# Patient Record
Sex: Female | Born: 1955 | Race: White | Hispanic: No | Marital: Married | Smoking: Current every day smoker
Health system: Southern US, Community
[De-identification: ages and names within clinical notes are randomized; demographics above are authoritative.]

## PROBLEM LIST (undated history)

## (undated) DIAGNOSIS — F32A Depression, unspecified: Secondary | ICD-10-CM

## (undated) DIAGNOSIS — N83209 Unspecified ovarian cyst, unspecified side: Secondary | ICD-10-CM

## (undated) DIAGNOSIS — F329 Major depressive disorder, single episode, unspecified: Secondary | ICD-10-CM

## (undated) DIAGNOSIS — F419 Anxiety disorder, unspecified: Secondary | ICD-10-CM

## (undated) HISTORY — PX: TONSILLECTOMY: SUR1361

## (undated) HISTORY — DX: Major depressive disorder, single episode, unspecified: F32.9

## (undated) HISTORY — DX: Anxiety disorder, unspecified: F41.9

## (undated) HISTORY — DX: Depression, unspecified: F32.A

---

## 2007-03-10 ENCOUNTER — Other Ambulatory Visit: Admission: RE | Admit: 2007-03-10 | Discharge: 2007-03-10 | Payer: Self-pay | Admitting: Gynecology

## 2007-11-08 ENCOUNTER — Emergency Department (HOSPITAL_COMMUNITY): Admission: EM | Admit: 2007-11-08 | Discharge: 2007-11-08 | Payer: Self-pay | Admitting: Emergency Medicine

## 2008-05-30 ENCOUNTER — Emergency Department (HOSPITAL_COMMUNITY): Admission: EM | Admit: 2008-05-30 | Discharge: 2008-05-30 | Payer: Self-pay | Admitting: Emergency Medicine

## 2010-04-22 ENCOUNTER — Encounter: Payer: Self-pay | Admitting: Gynecology

## 2010-07-12 LAB — URINALYSIS, ROUTINE W REFLEX MICROSCOPIC
Glucose, UA: NEGATIVE mg/dL
Hgb urine dipstick: NEGATIVE
Ketones, ur: NEGATIVE mg/dL
Nitrite: NEGATIVE
Protein, ur: NEGATIVE mg/dL
pH: 6 (ref 5.0–8.0)

## 2010-07-12 LAB — DIFFERENTIAL
Basophils Relative: 0 % (ref 0–1)
Eosinophils Absolute: 0 10*3/uL (ref 0.0–0.7)
Monocytes Absolute: 0.5 10*3/uL (ref 0.1–1.0)
Monocytes Relative: 4 % (ref 3–12)
Neutrophils Relative %: 87 % — ABNORMAL HIGH (ref 43–77)

## 2010-07-12 LAB — CBC
Hemoglobin: 14.2 g/dL (ref 12.0–15.0)
RBC: 4.53 MIL/uL (ref 3.87–5.11)
RDW: 13.7 % (ref 11.5–15.5)
WBC: 13.2 10*3/uL — ABNORMAL HIGH (ref 4.0–10.5)

## 2010-07-12 LAB — COMPREHENSIVE METABOLIC PANEL
ALT: 10 U/L (ref 0–35)
Alkaline Phosphatase: 86 U/L (ref 39–117)
CO2: 27 mEq/L (ref 19–32)
Glucose, Bld: 124 mg/dL — ABNORMAL HIGH (ref 70–99)
Potassium: 4 mEq/L (ref 3.5–5.1)
Sodium: 140 mEq/L (ref 135–145)
Total Protein: 7.5 g/dL (ref 6.0–8.3)

## 2011-07-01 ENCOUNTER — Ambulatory Visit (INDEPENDENT_AMBULATORY_CARE_PROVIDER_SITE_OTHER): Payer: PRIVATE HEALTH INSURANCE | Admitting: Family Medicine

## 2011-07-01 VITALS — BP 129/72 | HR 77 | Temp 98.3°F | Resp 16 | Ht 62.0 in | Wt 130.0 lb

## 2011-07-01 DIAGNOSIS — B349 Viral infection, unspecified: Secondary | ICD-10-CM

## 2011-07-01 DIAGNOSIS — B9789 Other viral agents as the cause of diseases classified elsewhere: Secondary | ICD-10-CM

## 2011-07-01 MED ORDER — MONTELUKAST SODIUM 10 MG PO TABS: 10.0000 mg | ORAL_TABLET | Freq: Every day | ORAL | Status: DC

## 2011-07-01 NOTE — Progress Notes (Signed)
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 She has no fever. She has had some insomnia and malaise as well.  Objective: No acute distress, alert and oriented x3  HEENT: Mild retraction of both years otherwise negative  Chest: Bilateral wheezes which are faint on inspiration and expiration  Heart: Regular no murmur  Assessment: Viral syndrome  Plan: Singulair 10 daily x10

## 2011-07-01 NOTE — Patient Instructions (Signed)
Viral Syndrome You or your child has Viral Syndrome. It is the most common infection causing "colds" and infections in the nose, throat, sinuses, and breathing tubes. Sometimes the infection causes nausea, vomiting, or diarrhea. The germ that causes the infection is a virus. No antibiotic or other medicine will kill it. There are medicines that you can take to make you or your child more comfortable.  HOME CARE INSTRUCTIONS   Rest in bed until you start to feel better.   If you have diarrhea or vomiting, eat small amounts of crackers and toast. Soup is helpful.   Do not give aspirin or medicine that contains aspirin to children.   Only take over-the-counter or prescription medicines for pain, discomfort, or fever as directed by your caregiver.  SEEK IMMEDIATE MEDICAL CARE IF:   You or your child has not improved within one week.   You or your child has pain that is not at least partially relieved by over-the-counter medicine.   Thick, colored mucus or blood is coughed up.   Discharge from the nose becomes thick yellow or green.   Diarrhea or vomiting gets worse.   There is any major change in your or your child's condition.   You or your child develops a skin rash, stiff neck, severe headache, or are unable to hold down food or fluid.   You or your child has an oral temperature above 102 F (38.9 C), not controlled by medicine.   Your baby is older than 3 months with a rectal temperature of 102 F (38.9 C) or higher.   Your baby is 3 months old or younger with a rectal temperature of 100.4 F (38 C) or higher.  Document Released: 03/03/2006 Document Revised: 03/07/2011 Document Reviewed: 03/04/2007 [employer] Patient Information 2012 [employer]. 

## 2011-07-02 ENCOUNTER — Telehealth: Payer: Self-pay

## 2011-07-02 NOTE — Telephone Encounter (Signed)
Advised pt she may be contagious and she should stay out of work today. Pt understood.

## 2011-07-02 NOTE — Telephone Encounter (Signed)
PT WAS SEEN LAST NIGHT AND WANTS TO KNOW IF THE "VIRUS" SHE WAS DIAGNOSED WITH IS CONTAGIOUS.

## 2011-07-04 ENCOUNTER — Telehealth: Payer: Self-pay

## 2011-07-04 NOTE — Telephone Encounter (Signed)
[place]   Pt is slightly better, would like to talk with nurse [phone]

## 2011-07-04 NOTE — Telephone Encounter (Signed)
Spoke with patient, she states that over all she is feeling somewhat better.  She had a severe HA when she woke up yesterday--she took Motrin 600mg  and it was gone later that day.  Still having random bouts with n/v and decreased appetite, but is pushing fluids and keeping a simple diet.    She would like an OOW note for Tuesday and Wednesday faxed to her at her fax at work--#[phone].  Patient advised if no improvement/or worsening symptoms, RTC this weekend.  Is she ok for note?

## 2011-07-05 NOTE — Telephone Encounter (Signed)
Pt notified. Note faxed.

## 2011-07-05 NOTE — Telephone Encounter (Signed)
Sounds fine to me

## 2011-12-11 ENCOUNTER — Emergency Department (HOSPITAL_COMMUNITY): Payer: PRIVATE HEALTH INSURANCE

## 2011-12-11 ENCOUNTER — Emergency Department (HOSPITAL_COMMUNITY)
Admission: EM | Admit: 2011-12-11 | Discharge: 2011-12-11 | Disposition: A | Discharge status: Home / Self Care | Payer: PRIVATE HEALTH INSURANCE | Attending: Emergency Medicine | Admitting: Emergency Medicine

## 2011-12-11 ENCOUNTER — Encounter (HOSPITAL_COMMUNITY): Payer: Self-pay | Admitting: Emergency Medicine

## 2011-12-11 DIAGNOSIS — R1032 Left lower quadrant pain: Secondary | ICD-10-CM | POA: Insufficient documentation

## 2011-12-11 DIAGNOSIS — R61 Generalized hyperhidrosis: Secondary | ICD-10-CM | POA: Insufficient documentation

## 2011-12-11 DIAGNOSIS — F17200 Nicotine dependence, unspecified, uncomplicated: Secondary | ICD-10-CM | POA: Insufficient documentation

## 2011-12-11 DIAGNOSIS — N83209 Unspecified ovarian cyst, unspecified side: Secondary | ICD-10-CM

## 2011-12-11 HISTORY — DX: Unspecified ovarian cyst, unspecified side: N83.209

## 2011-12-11 LAB — CBC WITH DIFFERENTIAL/PLATELET
Basophils Absolute: 0 10*3/uL (ref 0.0–0.1)
Basophils Relative: 0 % (ref 0–1)
Eosinophils Absolute: 0.1 10*3/uL (ref 0.0–0.7)
Eosinophils Relative: 1 % (ref 0–5)
MCH: 29.6 pg (ref 26.0–34.0)
MCHC: 34.1 g/dL (ref 30.0–36.0)
MCV: 87 fL (ref 78.0–100.0)
Platelets: 209 10*3/uL (ref 150–400)
RDW: 13.9 % (ref 11.5–15.5)
WBC: 11.7 10*3/uL — ABNORMAL HIGH (ref 4.0–10.5)

## 2011-12-11 LAB — COMPREHENSIVE METABOLIC PANEL
ALT: 6 U/L (ref 0–35)
AST: 16 U/L (ref 0–37)
Calcium: 9.2 mg/dL (ref 8.4–10.5)
Sodium: 136 mEq/L (ref 135–145)
Total Protein: 7.1 g/dL (ref 6.0–8.3)

## 2011-12-11 LAB — PREGNANCY, URINE: Preg Test, Ur: NEGATIVE

## 2011-12-11 LAB — URINALYSIS, ROUTINE W REFLEX MICROSCOPIC
Glucose, UA: NEGATIVE mg/dL
Hgb urine dipstick: NEGATIVE
Leukocytes, UA: NEGATIVE
Specific Gravity, Urine: 1.027 (ref 1.005–1.030)
Urobilinogen, UA: 1 mg/dL (ref 0.0–1.0)

## 2011-12-11 MED ORDER — MORPHINE SULFATE 2 MG/ML IJ SOLN
2.0000 mg | Freq: Once | INTRAMUSCULAR | Status: AC
  Administered 2011-12-11: 2 mg via INTRAVENOUS
  Filled 2011-12-11: qty 1

## 2011-12-11 MED ORDER — HYDROCODONE-ACETAMINOPHEN 5-325 MG PO TABS: 1.0000 | ORAL_TABLET | ORAL | Status: AC | PRN

## 2011-12-11 MED ORDER — ONDANSETRON HCL 4 MG/2ML IJ SOLN
4.0000 mg | Freq: Once | INTRAMUSCULAR | Status: AC
  Administered 2011-12-11: 4 mg via INTRAVENOUS
  Filled 2011-12-11: qty 2

## 2011-12-11 NOTE — ED Notes (Signed)
Sudden onset of RLQ pain, started at 8:30am-- has hx of ovarian cyst, felt the same way-- took 800mg  Motrin- without relief (at 8:45) pain 8/10

## 2011-12-11 NOTE — ED Provider Notes (Signed)
History     CSN: [id number]  Arrival date & time 12/11/11  9147   First MD Initiated Contact with Patient 12/11/11 1058      Chief Complaint  Patient presents with  . RLQ pain     (Consider location/radiation/quality/duration/timing/severity/associated sxs/prior treatment) HPI Pt had sudden onset RLQ pain at 0830. Pain was sharp with no radiation. Pain has been constant but decreased in intensity. She states she became diaphoretic, pale and nauseated with pain onset. No vomiting/diarrhea. No urinary symptoms, vaginal bleeding or d/c. Pt has had multiple ovarian cysts and states the pain is exactly the same. She comes in because normally her pain is relieved with Ibuprofen and today it has not been.  Past Medical History  Diagnosis Date  . Ovarian cyst     Past Surgical History  Procedure Date  . Cesarean section     x two  . Tonsillectomy     No family history on file.  History  Substance Use Topics  . Smoking status: Current Every Day Smoker -- 0.5 packs/day    Types: Cigarettes  . Smokeless tobacco: Not on file  . Alcohol Use: No     occasionally    OB History    Grav Para Term Preterm Abortions TAB SAB Ect Mult Living                  Review of Systems  Constitutional: Positive for diaphoresis. Negative for fever and chills.  Respiratory: Negative for shortness of breath.   Cardiovascular: Negative for chest pain and palpitations.  Gastrointestinal: Positive for nausea and abdominal pain. Negative for vomiting, diarrhea and constipation.  Genitourinary: Positive for pelvic pain. Negative for dysuria, frequency, hematuria, flank pain, vaginal bleeding, vaginal discharge and vaginal pain.  Musculoskeletal: Negative for myalgias, back pain and arthralgias.  Skin: Positive for pallor. Negative for rash and wound.  Neurological: Positive for light-headedness. Negative for weakness, numbness and headaches.    Allergies  Review of patient's allergies indicates no  known allergies.  Home Medications   Current Outpatient Rx  Name Route Sig Dispense Refill  . IBUPROFEN 200 MG PO TABS Oral Take 800 mg by mouth every 6 (six) hours as needed. Pain    . ADULT MULTIVITAMIN W/MINERALS CH Oral Take 1 tablet by mouth daily.    Marland Kitchen HYDROCODONE-ACETAMINOPHEN 5-325 MG PO TABS Oral Take 1 tablet by mouth every 4 (four) hours as needed for pain. 15 tablet 0    BP 110/52  Pulse 75  Temp 98.4 F (36.9 C) (Oral)  Resp 16  SpO2 100%  LMP 10/27/2011  Physical Exam  Nursing note and vitals reviewed. Constitutional: She is oriented to person, place, and time. She appears well-developed and well-nourished. No distress.  HENT:  Head: Normocephalic and atraumatic.  Mouth/Throat: Oropharynx is clear and moist.  Eyes: EOM are normal. Pupils are equal, round, and reactive to light.  Neck: Normal range of motion. Neck supple.  Cardiovascular: Normal rate and regular rhythm.   Pulmonary/Chest: Effort normal and breath sounds normal. No respiratory distress. She has no wheezes. She has no rales.  Abdominal: Soft. Bowel sounds are normal. She exhibits no distension and no mass. There is no tenderness. There is no rebound and no guarding.  Musculoskeletal: Normal range of motion. She exhibits no edema and no tenderness.       No flank TTP  Neurological: She is alert and oriented to person, place, and time.  Skin: Skin is warm and dry. No rash  noted. No erythema.  Psychiatric: She has a normal mood and affect. Her behavior is normal.    ED Course  Procedures (including critical care time)  Labs Reviewed  CBC WITH DIFFERENTIAL - Abnormal; Notable for the following:    WBC 11.7 (*)     Neutrophils Relative 81 (*)     Neutro Abs 9.6 (*)     All other components within normal limits  URINALYSIS, ROUTINE W REFLEX MICROSCOPIC - Abnormal; Notable for the following:    Color, Urine AMBER (*)  BIOCHEMICALS MAY BE AFFECTED BY COLOR   APPearance CLOUDY (*)     Bilirubin  Urine SMALL (*)     Ketones, ur TRACE (*)     All other components within normal limits  COMPREHENSIVE METABOLIC PANEL  LIPASE, BLOOD  PREGNANCY, URINE   US Transvaginal Non-ob  12/11/2011  *RADIOLOGY REPORT*  Clinical Data: Right-sided pelvic pain.  History of ovarian cyst.  TRANSABDOMINAL AND TRANSVAGINAL ULTRASOUND OF PELVIS Technique:  Both transabdominal and transvaginal ultrasound examinations of the pelvis were performed. Transabdominal technique was performed for global imaging of the pelvis including uterus, ovaries, adnexal regions, and pelvic cul-de-sac.  It was necessary to proceed with endovaginal exam following the transabdominal exam to visualize the uterus, ovaries, and adnexa  .  Comparison:  CT of 05/30/2008.  No prior ultrasound.  Findings:  Uterus: 8.3 x 4.2 x 5.5 cm. Normal in morphology.  Endometrium: Normal for premenopausal state., 1.4 cm.  Right ovary:  4.7 x 3.4 x 3.9 cm.  Numerous follicles.  Suspect an involuting follicle which measures 2.1 cm on image 10 of series one.  Left ovary: 3.7 x 1.9 x 3.6 cm.  Follicles.  A peripherally hypervascular probable corpus luteal cyst measures 1.8 cm on image 53.  Other findings: Trace free pelvic fluid is likely physiologic.  IMPRESSION:  1.  Probable involuting follicle within the right ovary. 2.  Probable corpus luteal cyst within the left ovary.   Original Report Authenticated By: Consuello Bossier, M.D.    US Pelvis Complete  12/11/2011  *RADIOLOGY REPORT*  Clinical Data: Right-sided pelvic pain.  History of ovarian cyst.  TRANSABDOMINAL AND TRANSVAGINAL ULTRASOUND OF PELVIS Technique:  Both transabdominal and transvaginal ultrasound examinations of the pelvis were performed. Transabdominal technique was performed for global imaging of the pelvis including uterus, ovaries, adnexal regions, and pelvic cul-de-sac.  It was necessary to proceed with endovaginal exam following the transabdominal exam to visualize the uterus, ovaries, and adnexa   .  Comparison:  CT of 05/30/2008.  No prior ultrasound.  Findings:  Uterus: 8.3 x 4.2 x 5.5 cm. Normal in morphology.  Endometrium: Normal for premenopausal state., 1.4 cm.  Right ovary:  4.7 x 3.4 x 3.9 cm.  Numerous follicles.  Suspect an involuting follicle which measures 2.1 cm on image 10 of series one.  Left ovary: 3.7 x 1.9 x 3.6 cm.  Follicles.  A peripherally hypervascular probable corpus luteal cyst measures 1.8 cm on image 53.  Other findings: Trace free pelvic fluid is likely physiologic.  IMPRESSION:  1.  Probable involuting follicle within the right ovary. 2.  Probable corpus luteal cyst within the left ovary.   Original Report Authenticated By: Consuello Bossier, M.D.      1. Ovarian cyst       MDM    Pt is feeling much better after 2 morphine. States she is ready to eat and would like to be d/c'd home. Pt advised to f/u with  OB/GYN and take meds as needed. Return immediately for worsening pain, fever, chills or any concerns      Loren Racer, MD 12/11/11 1450

## 2011-12-11 NOTE — ED Notes (Signed)
Went to do hourly round patient out of room with xray

## 2013-06-17 ENCOUNTER — Ambulatory Visit (INDEPENDENT_AMBULATORY_CARE_PROVIDER_SITE_OTHER): Payer: PRIVATE HEALTH INSURANCE | Admitting: Family Medicine

## 2013-06-17 VITALS — BP 122/72 | HR 79 | Temp 97.9°F | Resp 16 | Ht 63.5 in | Wt 128.0 lb

## 2013-06-17 DIAGNOSIS — F411 Generalized anxiety disorder: Secondary | ICD-10-CM

## 2013-06-17 DIAGNOSIS — F419 Anxiety disorder, unspecified: Secondary | ICD-10-CM

## 2013-06-17 DIAGNOSIS — J329 Chronic sinusitis, unspecified: Secondary | ICD-10-CM

## 2013-06-17 DIAGNOSIS — R059 Cough, unspecified: Secondary | ICD-10-CM

## 2013-06-17 DIAGNOSIS — R05 Cough: Secondary | ICD-10-CM

## 2013-06-17 MED ORDER — HYDROCOD POLST-CHLORPHEN POLST 10-8 MG/5ML PO LQCR: 5.0000 mL | Freq: Every evening | ORAL | Status: DC | PRN

## 2013-06-17 MED ORDER — AMOXICILLIN 875 MG PO TABS: 875.0000 mg | ORAL_TABLET | Freq: Two times a day (BID) | ORAL | Status: DC

## 2013-06-17 MED ORDER — HYDROXYZINE HCL 25 MG PO TABS: 12.5000 mg | ORAL_TABLET | Freq: Three times a day (TID) | ORAL | Status: DC | PRN

## 2013-06-17 NOTE — Patient Instructions (Signed)
Finish antibiotic as directed Mucinex (plain) to help thin secretions Tussionex cough syrup- 1 teaspoon (ONLY) at bedtime as needed for cough Vistaril- 1/2 to one tablet every 8 hours as needed for anxiety- may make you drowsy  Sinusitis Sinusitis is redness, soreness, and swelling (inflammation) of the paranasal sinuses. Paranasal sinuses are air pockets within the bones of your face (beneath the eyes, the middle of the forehead, or above the eyes). In healthy paranasal sinuses, mucus is able to drain out, and air is able to circulate through them by way of your nose. However, when your paranasal sinuses are inflamed, mucus and air can become trapped. This can allow bacteria and other germs to grow and cause infection. Sinusitis can develop quickly and last only a short time (acute) or continue over a long period (chronic). Sinusitis that lasts for more than 12 weeks is considered chronic.  CAUSES  Causes of sinusitis include:  Allergies.  Structural abnormalities, such as displacement of the cartilage that separates your nostrils (deviated septum), which can decrease the air flow through your nose and sinuses and affect sinus drainage.  Functional abnormalities, such as when the small hairs (cilia) that line your sinuses and help remove mucus do not work properly or are not present. SYMPTOMS  Symptoms of acute and chronic sinusitis are the same. The primary symptoms are pain and pressure around the affected sinuses. Other symptoms include:  Upper toothache.  Earache.  Headache.  Bad breath.  Decreased sense of smell and taste.  A cough, which worsens when you are lying flat.  Fatigue.  Fever.  Thick drainage from your nose, which often is green and may contain pus (purulent).  Swelling and warmth over the affected sinuses. DIAGNOSIS  Your caregiver will perform a physical exam. During the exam, your caregiver may:  Look in your nose for signs of abnormal growths in your  nostrils (nasal polyps).  Tap over the affected sinus to check for signs of infection.  View the inside of your sinuses (endoscopy) with a special imaging device with a light attached (endoscope), which is inserted into your sinuses. If your caregiver suspects that you have chronic sinusitis, one or more of the following tests may be recommended:  Allergy tests.  Nasal culture A sample of mucus is taken from your nose and sent to a lab and screened for bacteria.  Nasal cytology A sample of mucus is taken from your nose and examined by your caregiver to determine if your sinusitis is related to an allergy. TREATMENT  Most cases of acute sinusitis are related to a viral infection and will resolve on their own within 10 days. Sometimes medicines are prescribed to help relieve symptoms (pain medicine, decongestants, nasal steroid sprays, or saline sprays).  However, for sinusitis related to a bacterial infection, your caregiver will prescribe antibiotic medicines. These are medicines that will help kill the bacteria causing the infection.  Rarely, sinusitis is caused by a fungal infection. In theses cases, your caregiver will prescribe antifungal medicine. For some cases of chronic sinusitis, surgery is needed. Generally, these are cases in which sinusitis recurs more than 3 times per year, despite other treatments. HOME CARE INSTRUCTIONS   Drink plenty of water. Water helps thin the mucus so your sinuses can drain more easily.  Use a humidifier.  Inhale steam 3 to 4 times a day (for example, sit in the bathroom with the shower running).  Apply a warm, moist washcloth to your face 3 to 4 times a  day, or as directed by your caregiver.  Use saline nasal sprays to help moisten and clean your sinuses.  Take over-the-counter or prescription medicines for pain, discomfort, or fever only as directed by your caregiver. SEEK IMMEDIATE MEDICAL CARE IF:  You have increasing pain or severe  headaches.  You have nausea, vomiting, or drowsiness.  You have swelling around your face.  You have vision problems.  You have a stiff neck.  You have difficulty breathing. MAKE SURE YOU:   Understand these instructions.  Will watch your condition.  Will get help right away if you are not doing well or get worse. Document Released: 03/18/2005 Document Revised: 06/10/2011 Document Reviewed: 04/02/2011 [facility] Patient Information 2014 [place], [place].

## 2013-06-17 NOTE — Progress Notes (Signed)
   Subjective:    Patient ID: Sarah Sanders, female    DOB: [birth year], 58 y.o.   MRN: [id number]  HPI Patient has had several week history of severe nasal drainage, cough, headache. Nasal drainage clear at first, but has turned thick and yellow/green. Facial pressure. Copious post nasal drainage.This morning with ear pain which was better after she got going. Took claritin with no relief. Took sudafed without improvement. Motrin without relief. Has increased fluids. Fever first couple of days of symptoms. Some chills.  Has decreased tobacco use to 2 cigarettes a day.  Has had ringing in ears when she started going through menopause. Saw ENT, who told her she had no hearing loss, and was going to have to live with it. Worsening with increased anxiety.  Tries to get plenty of exercise, decreased caffeine. Has periods of anxiety, feels like throat closes. Currently hates job. Has tried anti anxiety medications in past with weight gain and headaches. Anxiety infrequent, can go for 2 months without episode. Would like something she can use as needed that is not "addictive."  Does not have primary care provider. Has not had screening mammo, colonoscopy. [facility]  Review of Systems No shortness of breath or chest pain.     Objective:   Physical Exam  Constitutional: She appears well-developed and well-nourished.  HENT:  Head: Normocephalic and atraumatic.  Right Ear: Tympanic membrane, external ear and ear canal normal.  Left Ear: Tympanic membrane, external ear and ear canal normal.  Nose: Mucosal edema, rhinorrhea and septal deviation present. Right sinus exhibits maxillary sinus tenderness and frontal sinus tenderness. Left sinus exhibits maxillary sinus tenderness and frontal sinus tenderness.  Mouth/Throat: Uvula is midline. Oropharyngeal exudate present.      Assessment & Plan:  1. Cough - chlorpheniramine-HYDROcodone (TUSSIONEX  PENNKINETIC ER) 10-8 MG/5ML LQCR; Take 5 mLs by mouth at bedtime as needed for cough (cough).  Dispense: 70 mL; Refill: 0  2. Sinusitis - amoxicillin (AMOXIL) 875 MG tablet; Take 1 tablet (875 mg total) by mouth 2 (two) times daily.  Dispense: 20 tablet; Refill: 0 -Mucinex prn 3. Anxiety - hydrOXYzine (ATARAX/VISTARIL) 25 MG tablet; Take 0.5-1 tablets (12.5-25 mg total) by mouth every 8 (eight) hours as needed for anxiety.  Dispense: 30 tablet; Refill: 0   Patient to have appointment at 104 to establish care.   Sarah Belfasteborah B. Bleu Minerd, FNP-BC  [facility]  06/17/2013 1:43 PM   Discussed with [name]. NP and agree with above.   Sarah Sanders, MHS, PA-C [facility] [address] [place] [address] [phone] [facility] 06/17/2013 2:18 PM

## 2013-06-23 NOTE — Progress Notes (Signed)
Left a message for patient to return call.

## 2013-06-25 NOTE — Progress Notes (Signed)
Left a message for patient to return call to schedule CPE

## 2013-07-27 ENCOUNTER — Emergency Department (HOSPITAL_COMMUNITY)
Admission: EM | Admit: 2013-07-27 | Discharge: 2013-07-27 | Disposition: A | Discharge status: Home / Self Care | Payer: PRIVATE HEALTH INSURANCE | Attending: Emergency Medicine | Admitting: Emergency Medicine

## 2013-07-27 ENCOUNTER — Encounter (HOSPITAL_COMMUNITY): Payer: Self-pay | Admitting: Emergency Medicine

## 2013-07-27 DIAGNOSIS — F411 Generalized anxiety disorder: Secondary | ICD-10-CM | POA: Insufficient documentation

## 2013-07-27 DIAGNOSIS — Y9389 Activity, other specified: Secondary | ICD-10-CM | POA: Insufficient documentation

## 2013-07-27 DIAGNOSIS — IMO0002 Reserved for concepts with insufficient information to code with codable children: Secondary | ICD-10-CM | POA: Insufficient documentation

## 2013-07-27 DIAGNOSIS — F17200 Nicotine dependence, unspecified, uncomplicated: Secondary | ICD-10-CM | POA: Insufficient documentation

## 2013-07-27 DIAGNOSIS — S058X9A Other injuries of unspecified eye and orbit, initial encounter: Secondary | ICD-10-CM | POA: Insufficient documentation

## 2013-07-27 DIAGNOSIS — Z8742 Personal history of other diseases of the female genital tract: Secondary | ICD-10-CM | POA: Insufficient documentation

## 2013-07-27 DIAGNOSIS — S0501XA Injury of conjunctiva and corneal abrasion without foreign body, right eye, initial encounter: Secondary | ICD-10-CM

## 2013-07-27 DIAGNOSIS — Z23 Encounter for immunization: Secondary | ICD-10-CM | POA: Insufficient documentation

## 2013-07-27 DIAGNOSIS — Z79899 Other long term (current) drug therapy: Secondary | ICD-10-CM | POA: Insufficient documentation

## 2013-07-27 DIAGNOSIS — Y9289 Other specified places as the place of occurrence of the external cause: Secondary | ICD-10-CM | POA: Insufficient documentation

## 2013-07-27 DIAGNOSIS — Z792 Long term (current) use of antibiotics: Secondary | ICD-10-CM | POA: Insufficient documentation

## 2013-07-27 MED ORDER — ERYTHROMYCIN 5 MG/GM OP OINT
TOPICAL_OINTMENT | Freq: Once | OPHTHALMIC | Status: AC
  Administered 2013-07-27: 1 via OPHTHALMIC
  Filled 2013-07-27: qty 3.5

## 2013-07-27 MED ORDER — FLUORESCEIN SODIUM 1 MG OP STRP
1.0000 | ORAL_STRIP | Freq: Once | OPHTHALMIC | Status: AC
  Administered 2013-07-27: 1 via OPHTHALMIC
  Filled 2013-07-27: qty 1

## 2013-07-27 MED ORDER — ACETAMINOPHEN 500 MG PO TABS: 1000.0000 mg | ORAL_TABLET | Freq: Once | ORAL | Status: DC

## 2013-07-27 MED ORDER — TETRACAINE HCL 0.5 % OP SOLN
1.0000 [drp] | Freq: Once | OPHTHALMIC | Status: AC
  Administered 2013-07-27: 1 [drp] via OPHTHALMIC
  Filled 2013-07-27: qty 2

## 2013-07-27 MED ORDER — OXYCODONE-ACETAMINOPHEN 5-325 MG PO TABS: 1.0000 | ORAL_TABLET | Freq: Four times a day (QID) | ORAL | Status: DC | PRN

## 2013-07-27 MED ORDER — TETANUS-DIPHTH-ACELL PERTUSSIS 5-2.5-18.5 LF-MCG/0.5 IM SUSP
0.5000 mL | Freq: Once | INTRAMUSCULAR | Status: AC
  Administered 2013-07-27: 0.5 mL via INTRAMUSCULAR
  Filled 2013-07-27: qty 0.5

## 2013-07-27 NOTE — ED Notes (Signed)
Pt states that her dog was in bed with her and must have been spooked by something around 0300 and dog lunged back paw caught  pt's right eye. Pt eye draining and pt having trouble keeping eye open.

## 2013-07-27 NOTE — ED Provider Notes (Signed)
CSN: [id number]     Arrival date & time 07/27/13  0801 History   First MD Initiated Contact with Patient 07/27/13 0809     Chief Complaint  Patient presents with  . Eye Injury     (Consider location/radiation/quality/duration/timing/severity/associated sxs/prior Treatment) The history is provided by the patient.  Sarah Sanders is a 58 y.o. female hx of ovarian cyst here with R eye pain. Her dog was in bed with her last night. He was startled around 3am and jumped towards her. She notes that the dog may have accidentally scratched her right eye with its paw. Had some pain and then fell asleep. Woke up around 5 am with more pain. She has pain when she opens her eyes and is slightly photophobic. But she denies blurry vision. Doesn't use contact lenses. Unknown tetanus.    Past Medical History  Diagnosis Date  . Ovarian cyst   . Anxiety   . Depression    Past Surgical History  Procedure Laterality Date  . Cesarean section      x two  . Tonsillectomy     No family history on file. History  Substance Use Topics  . Smoking status: Current Every Day Smoker -- 0.50 packs/day    Types: Cigarettes  . Smokeless tobacco: Not on file  . Alcohol Use: No     Comment: occasionally   OB History   Grav Para Term Preterm Abortions TAB SAB Ect Mult Living                 Review of Systems  HENT:       R eye pain   All other systems reviewed and are negative.     Allergies  Review of patient's allergies indicates no known allergies.  Home Medications   Prior to Admission medications   Medication Sig Start Date End Date Taking? Authorizing Provider  amoxicillin (AMOXIL) 875 MG tablet Take 1 tablet (875 mg total) by mouth 2 (two) times daily. 06/17/13   Emi Belfasteborah B Gessner, FNP  chlorpheniramine-HYDROcodone [facility](TUSSIONEX PENNKINETIC ER) 10-8 MG/5ML LQCR Take 5 mLs by mouth at bedtime as needed for cough (cough). 06/17/13   Emi Belfasteborah B Gessner, FNP  hydrOXYzine (ATARAX/VISTARIL) 25 MG tablet  Take 0.5-1 tablets (12.5-25 mg total) by mouth every 8 (eight) hours as needed for anxiety. 06/17/13   Emi Belfasteborah B Gessner, FNP   BP 180/70  Pulse 85  Temp(Src) 98.1 F (36.7 C) (Oral)  Resp 18  SpO2 100% Physical Exam  Nursing note and vitals reviewed. Constitutional: She is oriented to person, place, and time. She appears well-developed and well-nourished.  Uncomfortable   HENT:  Head: Normocephalic.  Mouth/Throat: Oropharynx is clear and moist.  Eyes: EOM are normal.  R eye with photophobia. No papilledema on exam. On flourescein, there is small corneal abrasion right over the pupil. No active extravasation of vitreous material. No hyphema. Lids everted and no obvious foreign body under the lids. L eye no corneal abrasion   Neck: Normal range of motion. Neck supple.  Cardiovascular: Normal rate.   Pulmonary/Chest: Effort normal.  Abdominal: Soft.  Musculoskeletal: Normal range of motion.  Neurological: She is alert and oriented to person, place, and time.  Skin: Skin is warm and dry.  Psychiatric: She has a normal mood and affect. Her behavior is normal. Judgment and thought content normal.    ED Course  Procedures (including critical care time) Labs Review Labs Reviewed - No data to display  Imaging Review No results found.  EKG Interpretation None      MDM   Final diagnoses:  None   Sarah Sanders is a 58 y.o. female here with R corneal abrasion. Tetanus updated. Given erythromycin ophthalmic ointment. Recommend motrin, tylenol for pain. Will have her f/u with ophtho.     Richardean Canalavid H Yao, MD 07/27/13 [phone]

## 2013-07-27 NOTE — Discharge Instructions (Signed)
Take tylenol, motrin for pain.   Use erythromycin ointment three times a day for a week.   You also may use artificial eye drops to keep your eye moist.   Follow up with an eye doctor.   Return to ER if you have severe pain, fever, purulent drainage from the eye.

## 2014-08-11 ENCOUNTER — Emergency Department (HOSPITAL_COMMUNITY)
Admission: EM | Admit: 2014-08-11 | Discharge: 2014-08-11 | Disposition: A | Discharge status: Home / Self Care | Payer: PRIVATE HEALTH INSURANCE | Attending: Emergency Medicine | Admitting: Emergency Medicine

## 2014-08-11 DIAGNOSIS — R5381 Other malaise: Secondary | ICD-10-CM | POA: Diagnosis not present

## 2014-08-11 DIAGNOSIS — Z720 Tobacco use: Secondary | ICD-10-CM | POA: Insufficient documentation

## 2014-08-11 DIAGNOSIS — Z8742 Personal history of other diseases of the female genital tract: Secondary | ICD-10-CM | POA: Insufficient documentation

## 2014-08-11 DIAGNOSIS — F419 Anxiety disorder, unspecified: Secondary | ICD-10-CM | POA: Insufficient documentation

## 2014-08-11 DIAGNOSIS — R531 Weakness: Secondary | ICD-10-CM | POA: Diagnosis not present

## 2014-08-11 DIAGNOSIS — R5383 Other fatigue: Secondary | ICD-10-CM | POA: Diagnosis not present

## 2014-08-11 DIAGNOSIS — R112 Nausea with vomiting, unspecified: Secondary | ICD-10-CM | POA: Diagnosis not present

## 2014-08-11 DIAGNOSIS — Z79899 Other long term (current) drug therapy: Secondary | ICD-10-CM | POA: Insufficient documentation

## 2014-08-11 LAB — CBC WITH DIFFERENTIAL/PLATELET
BASOS PCT: 0 % (ref 0–1)
Basophils Absolute: 0 10*3/uL (ref 0.0–0.1)
EOS ABS: 0.1 10*3/uL (ref 0.0–0.7)
EOS PCT: 1 % (ref 0–5)
HCT: 40.5 % (ref 36.0–46.0)
Hemoglobin: 13.7 g/dL (ref 12.0–15.0)
LYMPHS ABS: 1.7 10*3/uL (ref 0.7–4.0)
Lymphocytes Relative: 22 % (ref 12–46)
MCH: 30.8 pg (ref 26.0–34.0)
MCHC: 33.8 g/dL (ref 30.0–36.0)
MCV: 91 fL (ref 78.0–100.0)
Monocytes Absolute: 0.5 10*3/uL (ref 0.1–1.0)
Monocytes Relative: 7 % (ref 3–12)
Neutro Abs: 5.4 10*3/uL (ref 1.7–7.7)
Neutrophils Relative %: 70 % (ref 43–77)
PLATELETS: 221 10*3/uL (ref 150–400)
RBC: 4.45 MIL/uL (ref 3.87–5.11)
RDW: 13 % (ref 11.5–15.5)
WBC: 7.7 10*3/uL (ref 4.0–10.5)

## 2014-08-11 LAB — URINALYSIS, ROUTINE W REFLEX MICROSCOPIC
BILIRUBIN URINE: NEGATIVE
GLUCOSE, UA: NEGATIVE mg/dL
HGB URINE DIPSTICK: NEGATIVE
Ketones, ur: NEGATIVE mg/dL
Leukocytes, UA: NEGATIVE
NITRITE: NEGATIVE
PH: 7 (ref 5.0–8.0)
Protein, ur: NEGATIVE mg/dL
Specific Gravity, Urine: 1.004 — ABNORMAL LOW (ref 1.005–1.030)
Urobilinogen, UA: 0.2 mg/dL (ref 0.0–1.0)

## 2014-08-11 LAB — COMPREHENSIVE METABOLIC PANEL
ALBUMIN: 4.4 g/dL (ref 3.5–5.0)
ALK PHOS: 104 U/L (ref 38–126)
ALT: 11 U/L — AB (ref 14–54)
AST: 17 U/L (ref 15–41)
Anion gap: 12 (ref 5–15)
BILIRUBIN TOTAL: 0.4 mg/dL (ref 0.3–1.2)
BUN: 9 mg/dL (ref 6–20)
CHLORIDE: 103 mmol/L (ref 101–111)
CO2: 25 mmol/L (ref 22–32)
Calcium: 9.6 mg/dL (ref 8.9–10.3)
Creatinine, Ser: 0.58 mg/dL (ref 0.44–1.00)
GFR calc Af Amer: >60 mL/min (ref >60)
GFR calc non Af Amer: >60 mL/min (ref >60)
Glucose, Bld: 113 mg/dL — ABNORMAL HIGH (ref 65–99)
POTASSIUM: 4.2 mmol/L (ref 3.5–5.1)
SODIUM: 140 mmol/L (ref 135–145)
TOTAL PROTEIN: 7.7 g/dL (ref 6.5–8.1)

## 2014-08-11 MED ORDER — ONDANSETRON 4 MG PO TBDP
4.0000 mg | ORAL_TABLET | Freq: Once | ORAL | Status: AC
  Administered 2014-08-11: 4 mg via ORAL
  Filled 2014-08-11: qty 1

## 2014-08-11 MED ORDER — ONDANSETRON HCL 4 MG PO TABS: 4.0000 mg | ORAL_TABLET | Freq: Four times a day (QID) | ORAL | Status: DC

## 2014-08-11 NOTE — ED Notes (Signed)
Water and crackers provided.  °

## 2014-08-11 NOTE — Discharge Instructions (Signed)

## 2014-08-11 NOTE — ED Notes (Signed)
Multiple complaints.  Weakness and not feeling well since Sunday.  Emesis x 1 yesterday and prior on Monday morning.  ?spider bite.  Sinus congestion. On/off headache.  Crying over nothing.  Stressed lately.

## 2014-08-11 NOTE — ED Notes (Signed)
Pt noted to have small scabbed area to rt inner thigh s/p to spider bite x 1week ago. No surrounding redness or drainage noted at this time. Area healing well.

## 2014-08-11 NOTE — ED Provider Notes (Signed)
CSN: [id number]     Arrival date & time 08/11/14  [id number] History   First MD Initiated Contact with Patient 08/11/14 0809     Chief Complaint  Patient presents with  . Weakness  . Emesis      Patient is a 59 y.o. female presenting with weakness and vomiting. The history is provided by the patient. No language interpreter was used.  Weakness  Emesis [name] presents with three-four days of nausea, fatigue, malaise.  She reports increased stress over the last month and over the last few days she has felt very poorly.  She reports fatigue and decreased energy, decreased appetite.  She vomited two days ago, now resolved.  She denies any fever, chest pain, sob, diarrhea, dysuria.  She feels very stressed and emotional.  She denies any active SI.  She had a bug bite on her right leg two days ago that was red and swollen, but now it is better.  Sxs are moderate, waxing and waning, improving.    Past Medical History  Diagnosis Date  . Ovarian cyst   . Anxiety   . Depression    Past Surgical History  Procedure Laterality Date  . Cesarean section      x two  . Tonsillectomy     No family history on file. History  Substance Use Topics  . Smoking status: Current Every Day Smoker -- 0.50 packs/day    Types: Cigarettes  . Smokeless tobacco: Not on file  . Alcohol Use: No     Comment: occasionally   OB History    No data available     Review of Systems  Gastrointestinal: Positive for vomiting.  Neurological: Positive for weakness.  All other systems reviewed and are negative.     Allergies  Aspirin  Home Medications   Prior to Admission medications   Medication Sig Start Date End Date Taking? Authorizing Provider  ibuprofen (ADVIL,MOTRIN) 200 MG tablet Take 400 mg by mouth every 6 (six) hours as needed for fever, headache, moderate pain or cramping.    Yes Historical Provider, MD  oxyCODONE-acetaminophen (PERCOCET) 5-325 MG per tablet Take 1-2 tablets by mouth every 6  (six) hours as needed. Patient not taking: Reported on 08/11/2014 07/27/13   Richardean Canalavid H Yao, MD   BP 134/87 mmHg  Pulse 77  Temp(Src) 98.2 F (36.8 C) (Oral)  Resp 18  SpO2 100% Physical Exam  Constitutional: She is oriented to person, place, and time. She appears well-developed and well-nourished.  HENT:  Head: Normocephalic and atraumatic.  Cardiovascular: Normal rate and regular rhythm.   No murmur heard. Pulmonary/Chest: Effort normal and breath sounds normal. No respiratory distress.  Abdominal: Soft. There is no tenderness. There is no rebound and no guarding.  Musculoskeletal: She exhibits no edema or tenderness.  Right upper thigh with 1cm healing eschar, minimal surrounding erythema.    Neurological: She is alert and oriented to person, place, and time.  Skin: Skin is warm and dry.  Psychiatric:  Anxious and tearful  Nursing note and vitals reviewed.   ED Course  Procedures (including critical care time) Labs Review Labs Reviewed  COMPREHENSIVE METABOLIC PANEL - Abnormal; Notable for the following:    Glucose, Bld 113 (*)    ALT 11 (*)    All other components within normal limits  URINALYSIS, ROUTINE W REFLEX MICROSCOPIC - Abnormal; Notable for the following:    Specific Gravity, Urine 1.004 (*)    All other components within normal limits  CBC WITH DIFFERENTIAL/PLATELET    Imaging Review No results found.   EKG Interpretation None      MDM   Final diagnoses:  Malaise  Non-intractable vomiting with nausea, vomiting of unspecified type    Patient here for evaluation of nausea/vomiting, malaise.  Pt nontoxic appearing on exam.  Hx and presentation is not c/w SBO, serious bacterial infection, anemia, or major electrolyte abnormality.  Question stress/depression as trigger for her sxs.  Discussed therapy and pcp follow up as well as return precautions.  Pt without active SI or intent to harm self.      Tilden FossaElizabeth Inge Waldroup, MD 08/11/14 [phone]

## 2015-05-02 ENCOUNTER — Ambulatory Visit (INDEPENDENT_AMBULATORY_CARE_PROVIDER_SITE_OTHER): Payer: PRIVATE HEALTH INSURANCE | Admitting: Physician Assistant

## 2015-05-02 VITALS — BP 114/70 | HR 87 | Temp 98.5°F | Resp 18 | Ht 64.0 in | Wt 124.0 lb

## 2015-05-02 DIAGNOSIS — J018 Other acute sinusitis: Secondary | ICD-10-CM

## 2015-05-02 DIAGNOSIS — F419 Anxiety disorder, unspecified: Secondary | ICD-10-CM

## 2015-05-02 MED ORDER — HYDROXYZINE HCL 25 MG PO TABS: 25.0000 mg | ORAL_TABLET | Freq: Three times a day (TID) | ORAL | Status: DC | PRN

## 2015-05-02 MED ORDER — AMOXICILLIN 875 MG PO TABS: 875.0000 mg | ORAL_TABLET | Freq: Two times a day (BID) | ORAL | Status: DC

## 2015-05-02 NOTE — Patient Instructions (Addendum)
Please hydrate well with 64oz of water per day. Please take mucinex  every 12 hours.  Sinusitis, Adult Sinusitis is redness, soreness, and inflammation of the paranasal sinuses. Paranasal sinuses are air pockets within the bones of your face. They are located beneath your eyes, in the middle of your forehead, and above your eyes. In healthy paranasal sinuses, mucus is able to drain out, and air is able to circulate through them by way of your nose. However, when your paranasal sinuses are inflamed, mucus and air can become trapped. This can allow bacteria and other germs to grow and cause infection. Sinusitis can develop quickly and last only a short time (acute) or continue over a long period (chronic). Sinusitis that lasts for more than 12 weeks is considered chronic. CAUSES Causes of sinusitis include:  Allergies.  Structural abnormalities, such as displacement of the cartilage that separates your nostrils (deviated septum), which can decrease the air flow through your nose and sinuses and affect sinus drainage.  Functional abnormalities, such as when the small hairs (cilia) that line your sinuses and help remove mucus do not work properly or are not present. SIGNS AND SYMPTOMS Symptoms of acute and chronic sinusitis are the same. The primary symptoms are pain and pressure around the affected sinuses. Other symptoms include:  Upper toothache.  Earache.  Headache.  Bad breath.  Decreased sense of smell and taste.  A cough, which worsens when you are lying flat.  Fatigue.  Fever.  Thick drainage from your nose, which often is green and may contain pus (purulent).  Swelling and warmth over the affected sinuses. DIAGNOSIS Your health care provider will perform a physical exam. During your exam, your health care provider may perform any of the following to help determine if you have acute sinusitis or chronic sinusitis:  Look in your nose for signs of abnormal growths in  your nostrils (nasal polyps).  Tap over the affected sinus to check for signs of infection.  View the inside of your sinuses using an imaging device that has a light attached (endoscope). If your health care provider suspects that you have chronic sinusitis, one or more of the following tests may be recommended:  Allergy tests.  Nasal culture. A sample of mucus is taken from your nose, sent to a lab, and screened for bacteria.  Nasal cytology. A sample of mucus is taken from your nose and examined by your health care provider to determine if your sinusitis is related to an allergy. TREATMENT Most cases of acute sinusitis are related to a viral infection and will resolve on their own within 10 days. Sometimes, medicines are prescribed to help relieve symptoms of both acute and chronic sinusitis. These may include pain medicines, decongestants, nasal steroid sprays, or saline sprays. However, for sinusitis related to a bacterial infection, your health care provider will prescribe antibiotic medicines. These are medicines that will help kill the bacteria causing the infection. Rarely, sinusitis is caused by a fungal infection. In these cases, your health care provider will prescribe antifungal medicine. For some cases of chronic sinusitis, surgery is needed. Generally, these are cases in which sinusitis recurs more than 3 times per year, despite other treatments. HOME CARE INSTRUCTIONS  Drink plenty of water. Water helps thin the mucus so your sinuses can drain more easily.  Use a humidifier.  Inhale steam 3-4 times a day (for example, sit in the bathroom with the shower running).  Apply a warm, moist washcloth to your face 3-4 times  a day, or as directed by your health care provider.  Use saline nasal sprays to help moisten and clean your sinuses.  Take medicines only as directed by your health care provider.  If you were prescribed either an antibiotic or antifungal medicine, finish it  all even if you start to feel better. SEEK IMMEDIATE MEDICAL CARE IF:  You have increasing pain or severe headaches.  You have nausea, vomiting, or drowsiness.  You have swelling around your face.  You have vision problems.  You have a stiff neck.  You have difficulty breathing.   This information is not intended to replace advice given to you by your health care provider. Make sure you discuss any questions you have with your health care provider.   Document Released: 03/18/2005 Document Revised: 04/08/2014 Document Reviewed: 04/02/2011 Elsevier Interactive Patient Education 2016 Elsevier Inc. Generalized Anxiety Disorder Generalized anxiety disorder (GAD) is a mental disorder. It interferes with life functions, including relationships, work, and school. GAD is different from normal anxiety, which everyone experiences at some point in their lives in response to specific life events and activities. Normal anxiety actually helps [place] prepare for and get through these life events and activities. Normal anxiety goes away after the event or activity is over.  GAD causes anxiety that is not necessarily related to specific events or activities. It also causes excess anxiety in proportion to specific events or activities. The anxiety associated with GAD is also difficult to control. GAD can vary from mild to severe. People with severe GAD can have intense waves of anxiety with physical symptoms (panic attacks).  SYMPTOMS The anxiety and worry associated with GAD are difficult to control. This anxiety and worry are related to many life events and activities and also occur more days than not for 6 months or longer. People with GAD also have three or more of the following symptoms (one or more in children):  Restlessness.   Fatigue.  Difficulty concentrating.   Irritability.  Muscle tension.  Difficulty sleeping or unsatisfying sleep. DIAGNOSIS GAD is diagnosed through an assessment by  your health care provider. Your health care provider will ask you questions aboutyour mood,physical symptoms, and events in your life. Your health care provider may ask you about your medical history and use of alcohol or drugs, including prescription medicines. Your health care provider may also do a physical exam and blood tests. Certain medical conditions and the use of certain substances can cause symptoms similar to those associated with GAD. Your health care provider may refer you to a mental health specialist for further evaluation. TREATMENT The following therapies are usually used to treat GAD:   Medication. Antidepressant medication usually is prescribed for long-term daily control. Antianxiety medicines may be added in severe cases, especially when panic attacks occur.   Talk therapy (psychotherapy). Certain types of talk therapy can be helpful in treating GAD by providing support, education, and guidance. A form of talk therapy called cognitive behavioral therapy can teach you healthy ways to think about and react to daily life events and activities.  Stress managementtechniques. These include yoga, meditation, and exercise and can be very helpful when they are practiced regularly. A mental health specialist can help determine which treatment is best for you. Some people see improvement with one therapy. However, other people require a combination of therapies.   This information is not intended to replace advice given to you by your health care provider. Make sure you discuss any questions you have with  your health care provider.   Document Released: 07/13/2012 Document Revised: 04/08/2014 Document Reviewed: 07/13/2012 Elsevier Interactive Patient Education [employer]
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
## 2015-05-08 ENCOUNTER — Telehealth: Payer: Self-pay

## 2015-05-08 DIAGNOSIS — F411 Generalized anxiety disorder: Secondary | ICD-10-CM

## 2015-05-08 NOTE — Telephone Encounter (Signed)
Pt states anxiety drug(did not know name of drug) has actually caused her more anxiety,she is requesting change   Best phone for pt is [phone]   [facility]

## 2015-05-10 MED ORDER — ALPRAZOLAM 0.25 MG PO TABS: 0.2500 mg | ORAL_TABLET | Freq: Every day | ORAL | Status: DC | PRN

## 2015-05-10 NOTE — Telephone Encounter (Signed)
Instructed [place], cma, to send via fax.

## 2015-05-10 NOTE — Telephone Encounter (Signed)
Advised that we will start a .  dosing once daily prn for her symptoms.  She will meet with me in 1 month, to discuss her improvement on drug.  She was concerned of feeling sedated, however we will try a much lower dose than what she has attempted prior.
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
## 2015-05-30 ENCOUNTER — Telehealth: Payer: Self-pay

## 2015-05-30 NOTE — Telephone Encounter (Signed)
ALPRAZolam (XANAX) 0.25 MG tablet [[id number]]  Is working perfectly for patient. She promised Sarah Sanders that she would call and notify her of the results and she wants her to know that she doesn't take a whole pill, she cuts it in half and it works great. Thank you.

## 2015-05-30 NOTE — Telephone Encounter (Signed)
ALPRAZolam (XANAX) 0.25 MG tablet [[id number]]  Is working perfectly for patient. She actually cuts the pill in half and she does great with that dosage.

## 2015-05-31 NOTE — Telephone Encounter (Signed)
Called pt and advised message from provider on their voicemail.  

## 2015-05-31 NOTE — Telephone Encounter (Signed)
That is awesome.  I am happy to see this.  Please remind her that I will need to see her myself, prior to a refill of this controlled substance.

## 2016-06-20 ENCOUNTER — Encounter (HOSPITAL_COMMUNITY): Payer: Self-pay | Admitting: Emergency Medicine

## 2016-06-20 ENCOUNTER — Ambulatory Visit (HOSPITAL_COMMUNITY)
Admission: EM | Admit: 2016-06-20 | Discharge: 2016-06-20 | Disposition: A | Discharge status: Home / Self Care | Payer: PRIVATE HEALTH INSURANCE | Attending: Family Medicine | Admitting: Family Medicine

## 2016-06-20 DIAGNOSIS — F319 Bipolar disorder, unspecified: Secondary | ICD-10-CM | POA: Diagnosis not present

## 2016-06-20 DIAGNOSIS — R05 Cough: Secondary | ICD-10-CM | POA: Diagnosis not present

## 2016-06-20 DIAGNOSIS — J0100 Acute maxillary sinusitis, unspecified: Secondary | ICD-10-CM | POA: Diagnosis not present

## 2016-06-20 DIAGNOSIS — R059 Cough, unspecified: Secondary | ICD-10-CM

## 2016-06-20 MED ORDER — QUETIAPINE FUMARATE 50 MG PO TABS: 50.0000 mg | ORAL_TABLET | Freq: Every day | ORAL | 0 refills | Status: DC

## 2016-06-20 MED ORDER — AMOXICILLIN-POT CLAVULANATE 875-125 MG PO TABS: 1.0000 | ORAL_TABLET | Freq: Two times a day (BID) | ORAL | 0 refills | Status: DC

## 2016-06-20 MED ORDER — PREDNISONE 20 MG PO TABS: ORAL_TABLET | ORAL | 0 refills | Status: DC

## 2016-06-20 NOTE — ED Provider Notes (Signed)
[facility]    CSN: [id number] Arrival date & time: 06/20/16  1215     History   Chief Complaint Chief Complaint  Patient presents with  . URI    HPI Sarah Sanders is a 61 y.o. female.   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 She has a history of bipolar type I has had very poor rapport with her previous psychiatrist, Dr. Tomasa Randunningham.. Patient has recently had a manic episode (about a month ago) followed by prolonged period of great fatigue. She is interested in getting on medicine for this.  Patient's had about 2 weeks of cough and sinus congestion along with an abrasion under her nose from the discharge. She is currently smoking about 4 cigarettes a day. She's been wheezing and having difficulty bringing up the phlegm.      Past Medical History:  Diagnosis Date  . Anxiety   . Depression   . Ovarian cyst     There are no active problems to display for this patient.   Past Surgical History:  Procedure Laterality Date  . CESAREAN SECTION     x two  . TONSILLECTOMY      OB History    No data available       Home Medications    Prior to Admission medications   Medication Sig Start Date End Date Taking? Authorizing Provider  amoxicillin-clavulanate (AUGMENTIN) 875-125 MG tablet Take 1 tablet by mouth every 12 (twelve) hours. 06/20/16   Elvina SidleKurt Felishia Wartman, MD  predniSONE (DELTASONE) 20 MG tablet Two daily with food 06/20/16   Elvina SidleKurt Kamani Lewter, MD  QUEtiapine (SEROQUEL) 50 MG tablet Take 1 tablet (50 mg total) by mouth at bedtime. 06/20/16   Elvina SidleKurt Ajay Strubel, MD    Family History History reviewed. No pertinent family history.  Social History Social History  Substance Use Topics  . Smoking status: Current Every Day Smoker    Packs/day: 0.50    Types: Cigarettes  . Smokeless tobacco: Never Used  . Alcohol use No     Comment: occasionally     Allergies   Aspirin   Review of Systems Review of  Systems  Constitutional: Positive for fatigue.  HENT: Positive for congestion and sinus pressure.   Respiratory: Positive for cough and wheezing.   Gastrointestinal: Negative.   Neurological: Negative.   Psychiatric/Behavioral: Positive for agitation and sleep disturbance. The patient is nervous/anxious and is hyperactive.      Physical Exam Triage Vital Signs ED Triage Vitals  Enc Vitals Group     BP      Pulse      Resp      Temp      Temp src      SpO2      Weight      Height      Head Circumference      Peak Flow      Pain Score      Pain Loc      Pain Edu?      Excl. in GC?    No data found.   Updated Vital Signs BP 129/74 (BP Location: Left Arm)   Pulse 91   Temp 98.7 F (37.1 C) (Oral)   Resp 16   SpO2 98%    Physical Exam  Constitutional: She is oriented to person, place, and time. She appears well-developed and well-nourished.  HENT:  Right Ear: External ear normal.  Left Ear: External ear normal.  Mouth/Throat: Oropharynx is clear and moist.  Eyes: Conjunctivae and EOM are normal. Pupils are equal, round, and reactive to light.  Neck: Normal range of motion. Neck supple.  Cardiovascular: Regular rhythm and normal heart sounds.   Pulmonary/Chest: Effort normal. She has wheezes. She has rales.  Musculoskeletal: Normal range of motion.  Neurological: She is alert and oriented to person, place, and time.  Skin: Skin is warm and dry.  Abrasion under her right nasal frenulum  Nursing note and vitals reviewed.    UC Treatments / Results  Labs (all labs ordered are listed, but only abnormal results are displayed) Labs Reviewed - No data to display  EKG  EKG Interpretation None       Radiology No results found.  Procedures Procedures (including critical care time)  Medications Ordered in UC Medications - No data to display   Initial Impression / Assessment and Plan / UC Course  I have reviewed the triage vital signs and the nursing  notes.  Pertinent labs & imaging results that were available during my care of the patient were reviewed by me and considered in my medical decision making (see chart for details).     Final Clinical Impressions(s) / UC Diagnoses   Final diagnoses:  Acute maxillary sinusitis, recurrence not specified  Cough  Bipolar 1 disorder (HCC)    New Prescriptions New Prescriptions   AMOXICILLIN-CLAVULANATE (AUGMENTIN) 875-125 MG TABLET    Take 1 tablet by mouth every 12 (twelve) hours.   PREDNISONE (DELTASONE) 20 MG TABLET    Two daily with food   QUETIAPINE (SEROQUEL) 50 MG TABLET    Take 1 tablet (50 mg total) by mouth at bedtime.     Elvina Sidle, MD 06/20/16 1255

## 2016-06-20 NOTE — ED Triage Notes (Signed)
Pt c/o cold sx onset: 11 days  Sx include: HA, prod cough, rib pain due to cough on left side, nasal drainage, bilateral ear pain, laryngitis  Smokes 4 cigs per day  Also c/o anxiety  Hx of Bipolar I... Has not had any meds in "over years"  A&O x4... NAD

## 2016-06-20 NOTE — Discharge Instructions (Signed)
Make an appointment with Dr. Donell BeersPlovsky at your earliest convenience

## 2016-07-09 ENCOUNTER — Ambulatory Visit (HOSPITAL_COMMUNITY)
Admission: EM | Admit: 2016-07-09 | Discharge: 2016-07-09 | Disposition: A | Discharge status: Home / Self Care | Payer: PRIVATE HEALTH INSURANCE | Attending: Internal Medicine | Admitting: Internal Medicine

## 2016-07-09 ENCOUNTER — Other Ambulatory Visit: Payer: Self-pay | Admitting: Family Medicine

## 2016-07-09 ENCOUNTER — Encounter (HOSPITAL_COMMUNITY): Payer: Self-pay | Admitting: *Deleted

## 2016-07-09 DIAGNOSIS — M62838 Other muscle spasm: Secondary | ICD-10-CM

## 2016-07-09 DIAGNOSIS — J40 Bronchitis, not specified as acute or chronic: Secondary | ICD-10-CM

## 2016-07-09 MED ORDER — BENZONATATE 100 MG PO CAPS: 200.0000 mg | ORAL_CAPSULE | Freq: Three times a day (TID) | ORAL | 0 refills | Status: DC | PRN

## 2016-07-09 MED ORDER — CYCLOBENZAPRINE HCL 10 MG PO TABS: 10.0000 mg | ORAL_TABLET | Freq: Two times a day (BID) | ORAL | 0 refills | Status: DC | PRN

## 2016-07-09 MED ORDER — ALBUTEROL SULFATE HFA 108 (90 BASE) MCG/ACT IN AERS: 1.0000 | INHALATION_SPRAY | Freq: Four times a day (QID) | RESPIRATORY_TRACT | 0 refills | Status: DC | PRN

## 2016-07-09 NOTE — ED Triage Notes (Signed)
Seen  Here  Over  sev  Weeks  Ago  Took  Anti  Biotics  And  Prednisone      Still  Coughing         And  Has  Pain    In  Sides   When  She  Coughs

## 2016-07-09 NOTE — ED Provider Notes (Signed)
CSN: [id number]     Arrival date & time 07/09/16  1259 History   First MD Initiated Contact with Patient 07/09/16 1410     Chief Complaint  Patient presents with  . Follow-up   (Consider location/radiation/quality/duration/timing/severity/associated sxs/prior Treatment) The history is provided by the patient.  Pt presented with consistent persistent cough x 2 weeks. Reports cough is non productive, worse at night.Pt reports rifgr sided chest wall/rib cage pain from consistent/deep coughing.  Denies fever/chills/SOB. Tx with Augmentin and prednisone  for Sinusitis on 3/22. Sinus sx resolved.   Past Medical History:  Diagnosis Date  . Anxiety   . Depression   . Ovarian cyst    Past Surgical History:  Procedure Laterality Date  . CESAREAN SECTION     x two  . TONSILLECTOMY     History reviewed. No pertinent family history. Social History  Substance Use Topics  . Smoking status: Current Every Day Smoker    Packs/day: 0.50    Types: Cigarettes  . Smokeless tobacco: Never Used  . Alcohol use No     Comment: occasionally   OB History    No data available     Review of Systems  Constitutional: Negative.   HENT: Negative.   Eyes: Negative.   Respiratory: Positive for cough. Negative for chest tightness, shortness of breath and wheezing.   Cardiovascular: Negative.   Gastrointestinal: Negative.   Genitourinary: Negative.   Musculoskeletal: Positive for back pain (right sided rib cage pain from deep coughing).  Skin: Negative.   Neurological: Negative.   Psychiatric/Behavioral: Negative.     Allergies  Aspirin  Home Medications   Prior to Admission medications   Medication Sig Start Date End Date Taking? Authorizing Provider  albuterol (PROVENTIL HFA;VENTOLIN HFA) 108 (90 Base) MCG/ACT inhaler Inhale 1-2 puffs into the lungs every 6 (six) hours as needed for wheezing or shortness of breath. 07/09/16   Dinah Lupa, NP  amoxicillin-clavulanate (AUGMENTIN) 875-125 MG  tablet Take 1 tablet by mouth every 12 (twelve) hours. 06/20/16   Elvina Sidle, MD  benzonatate (TESSALON) 100 MG capsule Take 2 capsules (200 mg total) by mouth 3 (three) times daily as needed for cough. 07/09/16   Faun Mcqueen, NP  cyclobenzaprine (FLEXERIL) 10 MG tablet Take 1 tablet (10 mg total) by mouth 2 (two) times daily as needed for muscle spasms. 07/09/16   Daijanae Rafalski, NP  predniSONE (DELTASONE) 20 MG tablet Two daily with food 06/20/16   Elvina Sidle, MD  QUEtiapine (SEROQUEL) 50 MG tablet Take 1 tablet (50 mg total) by mouth at bedtime. 06/20/16   Elvina Sidle, MD   Meds Ordered and Administered this Visit  Medications - No data to display  BP (!) 143/67 (BP Location: Right Arm) Comment: notified rn  Pulse 87   Temp 98.5 F (36.9 C) (Oral)   Resp 14   SpO2 99%  No data found.   Physical Exam  Constitutional: She appears well-developed and well-nourished. She appears distressed.  HENT:  Head: Normocephalic.  Eyes: Pupils are equal, round, and reactive to light.  Cardiovascular: Normal rate, regular rhythm and normal heart sounds.   Pulmonary/Chest: Effort normal and breath sounds normal. No respiratory distress. She has no wheezes. She has no rales. She exhibits no tenderness.  Abdominal: Soft. Bowel sounds are normal.  Musculoskeletal: She exhibits tenderness (Tenderness to right mid axillary line between 5-7 rib cage. Pain reproducibe to palpation. Pt satets pain worse with coughing. ).  Skin: Skin is warm.  Urgent Care Course     Procedures (including critical care time)  Labs Review Labs Reviewed - No data to display  Imaging Review No results found.   Visual Acuity Review  Right Eye Distance:   Left Eye Distance:   Bilateral Distance:    Right Eye Near:   Left Eye Near:    Bilateral Near:         MDM   1. Bronchitis   2. Muscle spasm   right sided chest wall/rib pain highly likely from muscle spasm from deep coughing.  Heating pad to right rib cage as advised. Ibuprofen as needed for pain. Use pillow for support when coughing/sneezing Side effects from Cyclobenzaprine and Benzonatate discussed.    Lyndie Vanderloop, NP 07/09/16 1431    Nemesis Rainwater, NP 07/09/16 1432

## 2016-07-09 NOTE — Discharge Instructions (Signed)
Heating pad to right rib cage as advised. Ibuprofen as needed for pain. Use pillow for support when coughing/sneezing

## 2017-06-06 ENCOUNTER — Encounter (HOSPITAL_COMMUNITY): Payer: Self-pay | Admitting: Emergency Medicine

## 2017-06-06 ENCOUNTER — Ambulatory Visit (HOSPITAL_COMMUNITY)
Admission: EM | Admit: 2017-06-06 | Discharge: 2017-06-06 | Disposition: A | Discharge status: Home / Self Care | Payer: PRIVATE HEALTH INSURANCE | Attending: Internal Medicine | Admitting: Internal Medicine

## 2017-06-06 DIAGNOSIS — B349 Viral infection, unspecified: Secondary | ICD-10-CM

## 2017-06-06 MED ORDER — FLUTICASONE PROPIONATE 50 MCG/ACT NA SUSP: 2.0000 | Freq: Every day | NASAL | 0 refills | Status: DC

## 2017-06-06 MED ORDER — IPRATROPIUM BROMIDE 0.06 % NA SOLN: 2.0000 | Freq: Four times a day (QID) | NASAL | 0 refills | Status: DC

## 2017-06-06 NOTE — Discharge Instructions (Signed)
Start flonase, atrovent nasal spray for nasal congestion/drainage. You can use over the counter nasal saline rinse such as neti pot for nasal congestion. Keep hydrated, your urine should be clear to pale yellow in color. Tylenol/motrin for fever and pain. Monitor for any worsening of symptoms, chest pain, shortness of breath, wheezing, swelling of the throat, follow up for reevaluation. Follow up here or with PCP for reevaluation if symptoms not improving after 10 day of onset of symptoms.

## 2017-06-06 NOTE — ED Triage Notes (Signed)
Pt here for URI sx with body aches and sinus pressure x 2 days

## 2017-06-06 NOTE — ED Provider Notes (Signed)
[facility]    CSN: [id number] Arrival date & time: 06/06/17  1001     History   Chief Complaint Chief Complaint  Patient presents with  . URI    HPI Sarah Sanders is a 62 y.o. female.   80-year-old female comes in for 2-day history of body aches, sinus pressure, ear pain.  Patient states the symptoms are intermittent without obvious aggravating or alleviating factor.  Denies cough, rhinorrhea, nasal congestion.  Subjective fever without chills or night sweats.  States symptoms reminds her of a sinus infection,  except that it is intermittent.  She took some aspirin for the pain without any relief.  Current everyday smoker, 4 cigarettes a day, down from 1 pack/day, on and off for the past 30 years.       Past Medical History:  Diagnosis Date  . Anxiety   . Depression   . Ovarian cyst     There are no active problems to display for this patient.   Past Surgical History:  Procedure Laterality Date  . CESAREAN SECTION     x two  . TONSILLECTOMY      OB History    No data available       Home Medications    Prior to Admission medications   Medication Sig Start Date End Date Taking? Authorizing Provider  albuterol (PROVENTIL HFA;VENTOLIN HFA) 108 (90 Base) MCG/ACT inhaler Inhale 1-2 puffs into the lungs every 6 (six) hours as needed for wheezing or shortness of breath. 07/09/16   Multani, Bhupinder, NP  amoxicillin-clavulanate (AUGMENTIN) 875-125 MG tablet Take 1 tablet by mouth every 12 (twelve) hours. 06/20/16   Elvina Sidle, MD  benzonatate (TESSALON) 100 MG capsule Take 2 capsules (200 mg total) by mouth 3 (three) times daily as needed for cough. 07/09/16   Multani, Bhupinder, NP  cyclobenzaprine (FLEXERIL) 10 MG tablet Take 1 tablet (10 mg total) by mouth 2 (two) times daily as needed for muscle spasms. 07/09/16   Multani, Bhupinder, NP  fluticasone (FLONASE) 50 MCG/ACT nasal spray Place 2 sprays into both nostrils daily. 06/06/17   Cathie Hoops, Amar Sippel V, PA-C    ipratropium (ATROVENT) 0.06 % nasal spray Place 2 sprays into both nostrils 4 (four) times daily. 06/06/17   Belinda Fisher, PA-C  predniSONE (DELTASONE) 20 MG tablet Two daily with food 06/20/16   Elvina Sidle, MD  QUEtiapine (SEROQUEL) 50 MG tablet Take 1 tablet (50 mg total) by mouth at bedtime. 06/20/16   Elvina Sidle, MD    Family History History reviewed. No pertinent family history.  Social History Social History   Tobacco Use  . Smoking status: Current Every Day Smoker    Packs/day: 0.50    Types: Cigarettes  . Smokeless tobacco: Never Used  Substance Use Topics  . Alcohol use: No    Comment: occasionally  . Drug use: No     Allergies   Aspirin   Review of Systems Review of Systems  Reason unable to perform ROS: See HPI as above.     Physical Exam Triage Vital Signs ED Triage Vitals  Enc Vitals Group     BP 06/06/17 1038 113/70     Pulse Rate 06/06/17 1038 87     Resp 06/06/17 1038 18     Temp 06/06/17 1038 98.5 F (36.9 C)     Temp Source 06/06/17 1038 Oral     SpO2 06/06/17 1038 98 %     Weight --      Height --  Head Circumference --      Peak Flow --      Pain Score 06/06/17 1039 2     Pain Loc --      Pain Edu? --      Excl. in [place]? --    No data found.  Updated Vital Signs BP 113/70 (BP Location: Right Arm)   Pulse 87   Temp 98.5 F (36.9 C) (Oral)   Resp 18   SpO2 98%   Physical Exam  Constitutional: She is oriented to person, place, and time. She appears well-developed and well-nourished. No distress.  HENT:  Head: Normocephalic and atraumatic.  Right Ear: External ear and ear canal normal. Tympanic membrane is erythematous. Tympanic membrane is not bulging.  Left Ear: External ear and ear canal normal. Tympanic membrane is erythematous. Tympanic membrane is not bulging.  Nose: Mucosal edema present. Right sinus exhibits maxillary sinus tenderness and frontal sinus tenderness. Left sinus exhibits maxillary sinus tenderness and  frontal sinus tenderness.  Mouth/Throat: Uvula is midline, oropharynx is clear and moist and mucous membranes are normal.  Eyes: Conjunctivae are normal. Pupils are equal, round, and reactive to light.  Neck: Normal range of motion. Neck supple.  Cardiovascular: Normal rate, regular rhythm and normal heart sounds. Exam reveals no gallop and no friction rub.  No murmur heard. Pulmonary/Chest: Effort normal and breath sounds normal. She has no decreased breath sounds. She has no wheezes. She has no rhonchi. She has no rales.  Lymphadenopathy:    She has no cervical adenopathy.  Neurological: She is alert and oriented to person, place, and time.  Skin: Skin is warm and dry.  Psychiatric: She has a normal mood and affect. Her behavior is normal. Judgment normal.     UC Treatments / Results  Labs (all labs ordered are listed, but only abnormal results are displayed) Labs Reviewed - No data to display  EKG  EKG Interpretation None       Radiology No results found.  Procedures Procedures (including critical care time)  Medications Ordered in UC Medications - No data to display   Initial Impression / Assessment and Plan / UC Course  I have reviewed the triage vital signs and the nursing notes.  Pertinent labs & imaging results that were available during my care of the patient were reviewed by me and considered in my medical decision making (see chart for details).    Discussed with patient history and exam most consistent with viral URI. Symptomatic treatment as needed.  Discussed prednisone for sinus pressure, patient would like to defer oral prednisone for now due to history of mood changes with the medicine.  Push fluids. Return precautions given.    Final Clinical Impressions(s) / UC Diagnoses   Final diagnoses:  Viral syndrome    ED Discharge Orders        Ordered    fluticasone (FLONASE) 50 MCG/ACT nasal spray  Daily     06/06/17 1058    ipratropium (ATROVENT)  0.06 % nasal spray  4 times daily     06/06/17 [address]1058        Raissa Dam V, [place]PA-C 06/06/17 1133

## 2017-07-02 ENCOUNTER — Encounter (HOSPITAL_COMMUNITY): Payer: Self-pay | Admitting: Emergency Medicine

## 2017-07-02 ENCOUNTER — Emergency Department (HOSPITAL_COMMUNITY)
Admission: EM | Admit: 2017-07-02 | Discharge: 2017-07-02 | Disposition: A | Discharge status: Home / Self Care | Payer: PRIVATE HEALTH INSURANCE | Attending: Emergency Medicine | Admitting: Emergency Medicine

## 2017-07-02 ENCOUNTER — Emergency Department (HOSPITAL_COMMUNITY): Payer: PRIVATE HEALTH INSURANCE

## 2017-07-02 DIAGNOSIS — Z720 Tobacco use: Secondary | ICD-10-CM

## 2017-07-02 DIAGNOSIS — J324 Chronic pansinusitis: Secondary | ICD-10-CM | POA: Diagnosis not present

## 2017-07-02 DIAGNOSIS — Z7289 Other problems related to lifestyle: Secondary | ICD-10-CM

## 2017-07-02 DIAGNOSIS — F1010 Alcohol abuse, uncomplicated: Secondary | ICD-10-CM | POA: Diagnosis not present

## 2017-07-02 DIAGNOSIS — J069 Acute upper respiratory infection, unspecified: Secondary | ICD-10-CM | POA: Diagnosis not present

## 2017-07-02 DIAGNOSIS — F1090 Alcohol use, unspecified, uncomplicated: Secondary | ICD-10-CM

## 2017-07-02 DIAGNOSIS — R0981 Nasal congestion: Secondary | ICD-10-CM | POA: Diagnosis present

## 2017-07-02 DIAGNOSIS — F17210 Nicotine dependence, cigarettes, uncomplicated: Secondary | ICD-10-CM | POA: Insufficient documentation

## 2017-07-02 MED ORDER — CHLORDIAZEPOXIDE HCL 25 MG PO CAPS: ORAL_CAPSULE | ORAL | 0 refills | Status: DC

## 2017-07-02 MED ORDER — AMOXICILLIN 500 MG PO CAPS: 500.0000 mg | ORAL_CAPSULE | Freq: Three times a day (TID) | ORAL | 0 refills | Status: DC

## 2017-07-02 NOTE — Discharge Instructions (Signed)
Contact a health care provider if: °You have a fever. °Your symptoms get worse. °Your symptoms do not improve within 10 days. °Get help right away if: °You have a severe headache. °You have persistent vomiting. °You have pain or swelling around your face or eyes. °You have vision problems. °You develop confusion. °Your neck is stiff. °You have trouble breathing. °

## 2017-07-02 NOTE — ED Provider Notes (Signed)
[facility]-EMERGENCY DEPT Provider Note   CSN: [id number] Arrival date & time: 07/02/17  [id number]     History   Chief Complaint Chief Complaint  Patient presents with  . Nasal Congestion  . Sinusitis  . Detox    HPI Sarah Sanders is a 62 y.o. female sxs began 3 days ago with flu like sxs, myalgias, body aches, nasal congestion, sinus pain, and intermittent diaphoresis. She feels dehydrated and has had increased thirst. Patient notes the she stopped drinking ETOH the day before.  She normally drinks 3-8 beers nightly but denies shakes or symptoms, or eye openers. The patient states that she has had seeral stressful years and her etoh use just crept up nightly. Since d/c etoh she has been nervous, weepy and unable to sleep. She has been using dayquil/nyquil for her cold sxs. She is a daily smoker,. SHe denies SI/HI  HPI  Past Medical History:  Diagnosis Date  . Anxiety   . Depression   . Ovarian cyst     There are no active problems to display for this patient.   Past Surgical History:  Procedure Laterality Date  . CESAREAN SECTION     x two  . TONSILLECTOMY       OB History   None      Home Medications    Prior to Admission medications   Medication Sig Start Date End Date Taking? Authorizing Provider  ibuprofen (ADVIL,MOTRIN) 200 MG tablet Take 400 mg by mouth every 6 (six) hours as needed for moderate pain.   Yes [provider]  Phenyleph-CPM-DM-APAP (ALKA-SELTZER PLUS COLD & COUGH) 07-31-08-325 MG CAPS Take 1 capsule by mouth every 8 (eight) hours as needed (cold symptoms).   Yes [provider]  albuterol (PROVENTIL HFA;VENTOLIN HFA) 108 (90 Base) MCG/ACT inhaler Inhale 1-2 puffs into the lungs every 6 (six) hours as needed for wheezing or shortness of breath. Patient not taking: Reported on 07/02/2017 07/09/16   Multani, Bhupinder, NP  fluticasone (FLONASE) 50 MCG/ACT nasal spray Place 2 sprays into both nostrils daily. Patient  not taking: Reported on 07/02/2017 06/06/17   Belinda FisherYu, Amy V, PA-C  ipratropium (ATROVENT) 0.06 % nasal spray Place 2 sprays into both nostrils 4 (four) times daily. Patient not taking: Reported on 07/02/2017 06/06/17   Belinda FisherYu, Amy V, PA-C  QUEtiapine (SEROQUEL) 50 MG tablet Take 1 tablet (50 mg total) by mouth at bedtime. Patient not taking: Reported on 07/02/2017 06/20/16   Elvina SidleLauenstein, Kurt, MD    Family History No family history on file.  Social History Social History   Tobacco Use  . Smoking status: Current Every Day Smoker    Packs/day: 0.50    Types: Cigarettes  . Smokeless tobacco: Never Used  Substance Use Topics  . Alcohol use: No    Comment: occasionally  . Drug use: No     Allergies   Aspirin and Prednisone   Review of Systems Review of Systems Ten systems reviewed and are negative for acute change, except as noted in the HPI.    Physical Exam Updated Vital Signs BP (!) 154/96 (BP Location: Left Arm)   Pulse (!) 110   Temp 98.8 F (37.1 C) (Oral)   Resp 18   SpO2 98%   Physical Exam  Constitutional: She is oriented to person, place, and time. She appears well-developed and well-nourished. No distress.  HENT:  Head: Normocephalic and atraumatic.  Right Ear: External ear normal.  Left Ear: External ear normal.  Mouth/Throat:  No oropharyngeal exudate.  Nasal erythema  Eyes: Pupils are equal, round, and reactive to light. Conjunctivae and EOM are normal. No scleral icterus.  Neck: Normal range of motion.  Cardiovascular: Normal rate, regular rhythm and normal heart sounds. Exam reveals no gallop and no friction rub.  No murmur heard. Pulmonary/Chest: Effort normal and breath sounds normal. No respiratory distress.  Cough- no wheezes  Abdominal: Soft. Bowel sounds are normal. She exhibits no distension and no mass. There is no tenderness. There is no guarding.  Neurological: She is alert and oriented to person, place, and time.  Skin: Skin is warm and dry. She is not  diaphoretic.  Psychiatric: Her mood appears anxious. Her speech is rapid and/or pressured. She is hyperactive.  Nursing note and vitals reviewed.    ED Treatments / Results  Labs (all labs ordered are listed, but only abnormal results are displayed) Labs Reviewed - No data to display  EKG None  Radiology No results found.  Procedures Procedures (including critical care time)  Medications Ordered in ED Medications - No data to display   Initial Impression / Assessment and Plan / ED Course  I have reviewed the triage vital signs and the nursing notes.  Pertinent labs & imaging results that were available during my care of the patient were reviewed by me and considered in my medical decision making (see chart for details).    Patient chest x-ray negative.  Patient given antibiotic to hold until she has had 10 days of continued sinus symptoms.  Patient seems to be having mild withdrawal symptoms including some agitation hyperactivity and inability to sleep.  I have given her a very short course of Librium taper to see the AVS for details.  She appears appropriate for discharge at this time.  She is given outpatient resources.  Discussed return precautions with the patient.  Final Clinical Impressions(s) / ED Diagnoses   Final diagnoses:  None    ED Discharge Orders    None       Arthor Captain, PA-C 07/02/17 1610    Raeford Razor, MD 07/03/17 [phone]

## 2017-07-02 NOTE — ED Triage Notes (Signed)
Patient here from home with complaints of nasal congestion and sinus problems. States "I have not been sleeping well, chills, achy". Reports that she has been drinking for years and decided to stop 3 days ago. States "idk if my problems are related to the detox or what".

## 2019-06-22 ENCOUNTER — Other Ambulatory Visit: Payer: Self-pay

## 2019-06-22 ENCOUNTER — Encounter (HOSPITAL_COMMUNITY): Payer: Self-pay

## 2019-06-22 ENCOUNTER — Ambulatory Visit (INDEPENDENT_AMBULATORY_CARE_PROVIDER_SITE_OTHER): Payer: Self-pay

## 2019-06-22 ENCOUNTER — Ambulatory Visit (HOSPITAL_COMMUNITY)
Admission: EM | Admit: 2019-06-22 | Discharge: 2019-06-22 | Disposition: A | Discharge status: Home / Self Care | Payer: Self-pay | Attending: Family Medicine | Admitting: Family Medicine

## 2019-06-22 DIAGNOSIS — M25572 Pain in left ankle and joints of left foot: Secondary | ICD-10-CM

## 2019-06-22 DIAGNOSIS — S82832A Other fracture of upper and lower end of left fibula, initial encounter for closed fracture: Secondary | ICD-10-CM

## 2019-06-22 NOTE — ED Provider Notes (Signed)
[facility]    CSN: [id number] Arrival date & time: 06/22/19  0801      History   Chief Complaint Chief Complaint  Patient presents with  . Ankle Pain    Left    HPI Sarah Sanders is a 64 y.o. female.   Patient is a 63 year old female that presents today with left ankle pain.  This has been ongoing issue for the past 2 weeks.  Prior to starting she got twisted up with her dog and the leash and fell to the ground injuring the left ankle.  The pain is intermittent and usually worse after doing walking or standing for prolonged period.  She has been icing, elevating, resting and taking Tylenol with relief at times.  Just describes as continuous pain that is like a pulling, cramping pain in the left lateral ankle.  Denies any numbness, tingling or loss of sensation.  ROS per HPI      Past Medical History:  Diagnosis Date  . Anxiety   . Depression   . Ovarian cyst     There are no problems to display for this patient.   Past Surgical History:  Procedure Laterality Date  . CESAREAN SECTION     x two  . TONSILLECTOMY      OB History   No obstetric history on file.      Home Medications    Prior to Admission medications   Medication Sig Start Date End Date Taking? Authorizing Provider  ibuprofen (ADVIL,MOTRIN) 200 MG tablet Take 400 mg by mouth every 6 (six) hours as needed for moderate pain.    [provider]  albuterol (PROVENTIL HFA;VENTOLIN HFA) 108 (90 Base) MCG/ACT inhaler Inhale 1-2 puffs into the lungs every 6 (six) hours as needed for wheezing or shortness of breath. Patient not taking: Reported on 07/02/2017 07/09/16 06/22/19  Multani, Bhupinder, NP  fluticasone (FLONASE) 50 MCG/ACT nasal spray Place 2 sprays into both nostrils daily. Patient not taking: Reported on 07/02/2017 06/06/17 06/22/19  Belinda Fisher, PA-C  ipratropium (ATROVENT) 0.06 % nasal spray Place 2 sprays into both nostrils 4 (four) times daily. Patient not taking: Reported on  07/02/2017 06/06/17 06/22/19  Belinda Fisher, PA-C  QUEtiapine (SEROQUEL) 50 MG tablet Take 1 tablet (50 mg total) by mouth at bedtime. Patient not taking: Reported on 07/02/2017 06/20/16 06/22/19  Elvina Sidle, MD    Family History History reviewed. No pertinent family history.  Social History Social History   Tobacco Use  . Smoking status: Current Every Day Smoker    Packs/day: 0.50    Types: Cigarettes  . Smokeless tobacco: Never Used  Substance Use Topics  . Alcohol use: No    Comment: occasionally  . Drug use: No     Allergies   Aspirin and Prednisone   Review of Systems Review of Systems   Physical Exam Triage Vital Signs ED Triage Vitals [06/22/19 0820]  Enc Vitals Group     BP (!) 188/70     Pulse Rate 86     Resp 18     Temp 98.4 F (36.9 C)     Temp Source Oral     SpO2 98 %     Weight      Height      Head Circumference      Peak Flow      Pain Score 7     Pain Loc      Pain Edu?      Excl.  in [place]?    No data found.  Updated Vital Signs BP (!) 170/65 Comment: obtained by Josue Falconi, np  Pulse 86   Temp 98.4 F (36.9 C) (Oral)   Resp 18   SpO2 98%   Visual Acuity Right Eye Distance:   Left Eye Distance:   Bilateral Distance:    Right Eye Near:   Left Eye Near:    Bilateral Near:     Physical Exam Vitals and nursing note reviewed.  Constitutional:      General: She is not in acute distress.    Appearance: Normal appearance. She is not ill-appearing, toxic-appearing or diaphoretic.  HENT:     Head: Normocephalic.     Nose: Nose normal.  Eyes:     Conjunctiva/sclera: Conjunctivae normal.  Pulmonary:     Effort: Pulmonary effort is normal.  Musculoskeletal:        General: No swelling. Normal range of motion.     Cervical back: Normal range of motion.     Comments: Mild tenderness to left lateral malleolus.   Skin:    General: Skin is warm and dry.     Findings: No rash.  Neurological:     Mental Status: She is alert.  Psychiatric:         Mood and Affect: Mood normal.      UC Treatments / Results  Labs (all labs ordered are listed, but only abnormal results are displayed) Labs Reviewed - No data to display  EKG   Radiology DG Ankle Complete Left  Result Date: 06/22/2019 CLINICAL DATA:  LEFT ankle pain for 2 weeks following injury EXAM: LEFT ANKLE COMPLETE - 3+ VIEW COMPARISON:  None FINDINGS: Osseous mineralization low normal. Joint spaces preserved. Subacute nondisplaced oblique distal LEFT fibular diaphyseal fracture with minimal periosteal reaction identified on oblique view. No additional fracture, dislocation, or bone destruction. Tiny plantar and Achilles insertion calcaneal spurs. IMPRESSION: Subacute nondisplaced oblique distal LEFT fibular diaphyseal fracture. Electronically Signed   By: Lavonia Dana M.D.   On: 06/22/2019 08:45    Procedures Procedures (including critical care time)  Medications Ordered in UC Medications - No data to display  Initial Impression / Assessment and Plan / UC Course  I have reviewed the triage vital signs and the nursing notes.  Pertinent labs & imaging results that were available during my care of the patient were reviewed by me and considered in my medical decision making (see chart for details).     Distal fibular fracture, subacute. Will place patient in cam walker boot and have her wear this for at least 4 to 6 weeks. RICE Ibuprofen for pain.  Contact given for primary care and for orthopedic follow-up Some concern blood pressure with first reading being 188/70 And second reading 170/65 She is going to call primary care.  No concerning signs or symptoms.  Final Clinical Impressions(s) / UC Diagnoses   Final diagnoses:  Other closed fracture of distal end of left fibula, initial encounter     Discharge Instructions     You have a fracture  I am going to place you in a walking boot you need to wear this and follow up with orthopedics.  If you are up and  moving around you needs to wear the boot.  You can take ibuprofen for pain.  Rest, ice.  Contact on discharge instructions for orthopedic and primary care follow-up     ED Prescriptions    None     PDMP not reviewed  this encounter.   Dahlia Byes A, NP 06/22/19 [phone]

## 2019-06-22 NOTE — ED Triage Notes (Signed)
Pt presents with left ankle injury from 2 weeks ago when she fell while walking her dog.

## 2019-06-22 NOTE — Discharge Instructions (Addendum)
You have a fracture  I am going to place you in a walking boot you need to wear this and follow up with orthopedics.  If you are up and moving around you needs to wear the boot.  You can take ibuprofen for pain.  Rest, ice.  Contact on discharge instructions for orthopedic and primary care follow-up

## 2019-08-04 ENCOUNTER — Telehealth: Payer: Self-pay

## 2019-08-04 ENCOUNTER — Encounter: Payer: Self-pay | Admitting: General Practice

## 2019-08-04 ENCOUNTER — Ambulatory Visit: Payer: Self-pay

## 2019-08-04 NOTE — Telephone Encounter (Signed)
TC to patient regarding no-show for her appointment this morning.  Pt states "I drove all around that building and there were no signs indicating [facility], where is this clinic located"?  This RN attempted to explain where [facility] is located, as well as, remind patient she can always call [facility] for directions if she was having trouble locating clinic, but patient kept interrupting RN.  Pt states she is upset and perhaps she will call back to reschedule.  Phone call ended by patient. SChaplin, RN,BSN

## 2021-07-28 IMAGING — DX DG ANKLE COMPLETE 3+V*L*
3 series · 3 of 3 positions shown · non-contrast
Comparison: None

CLINICAL DATA: LEFT ankle pain for 2 weeks following injury

EXAM:
LEFT ANKLE COMPLETE - 3+ VIEW

[ankle ap]
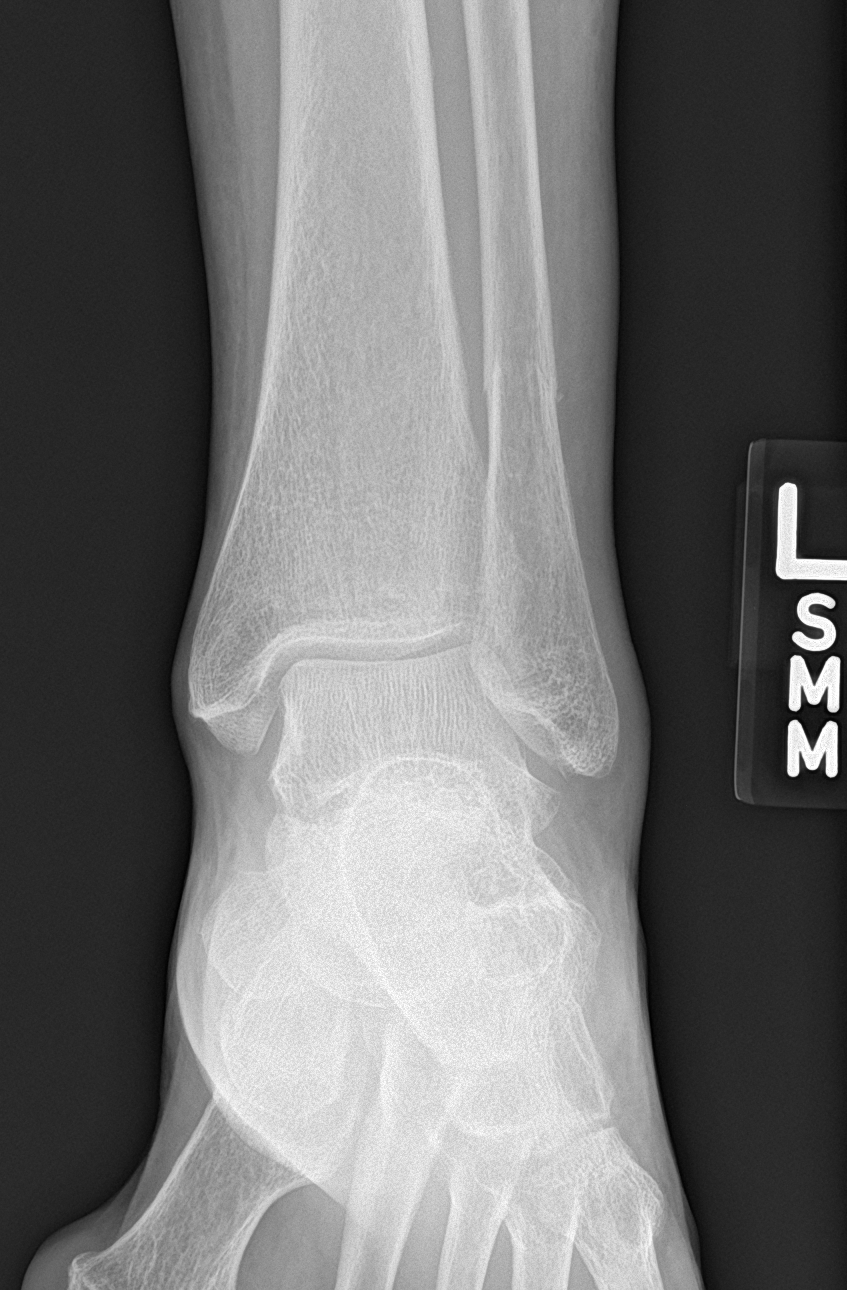

[ankle obl]
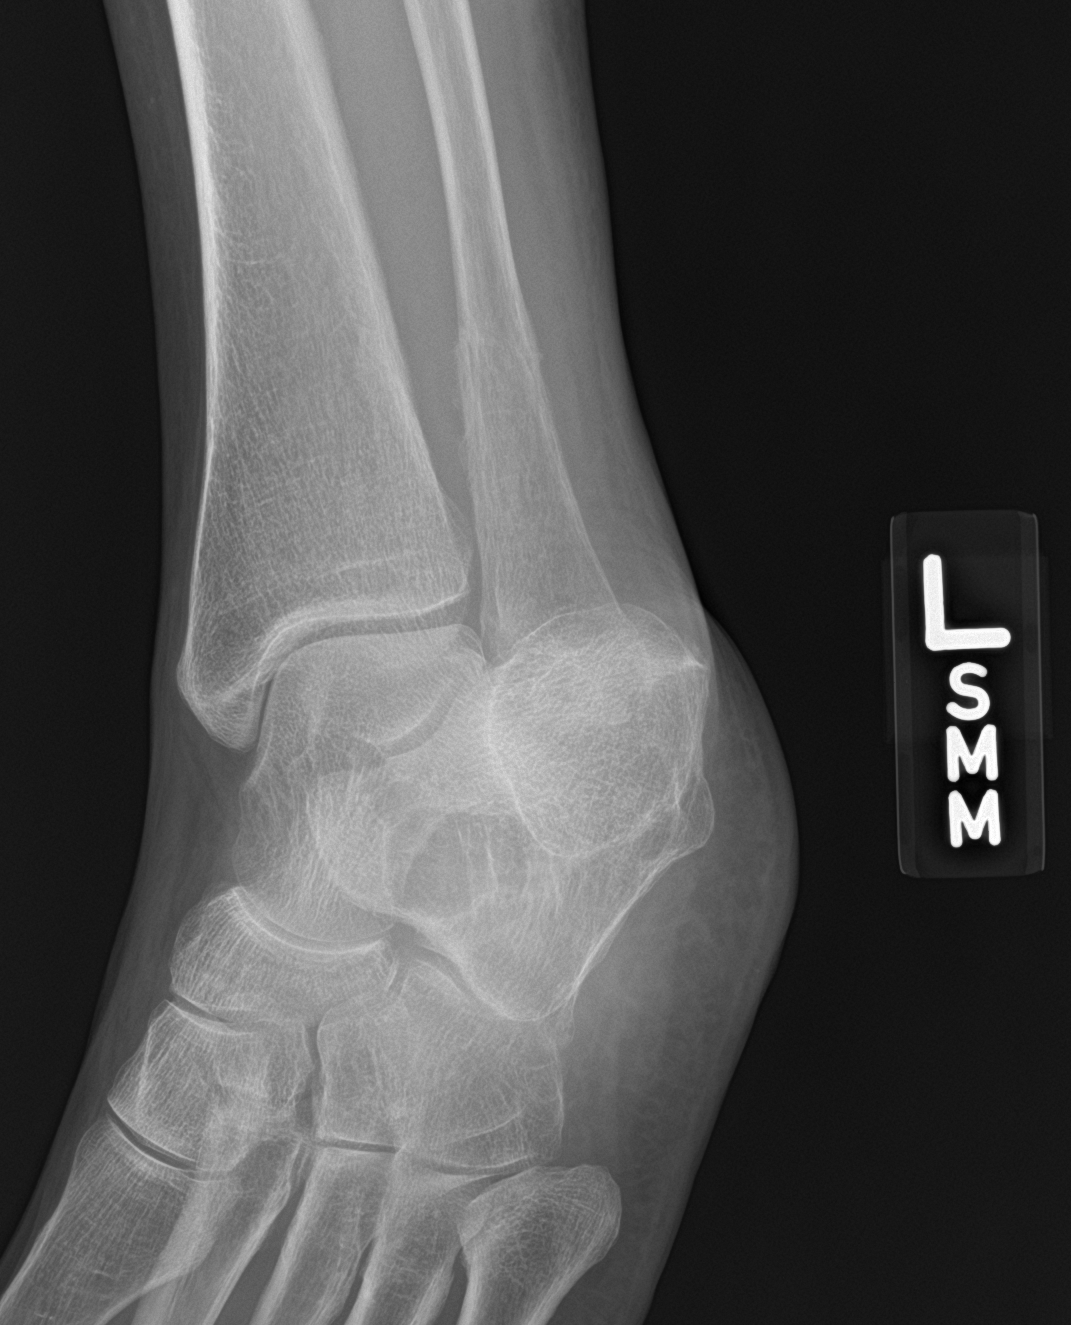

[ankle lat]
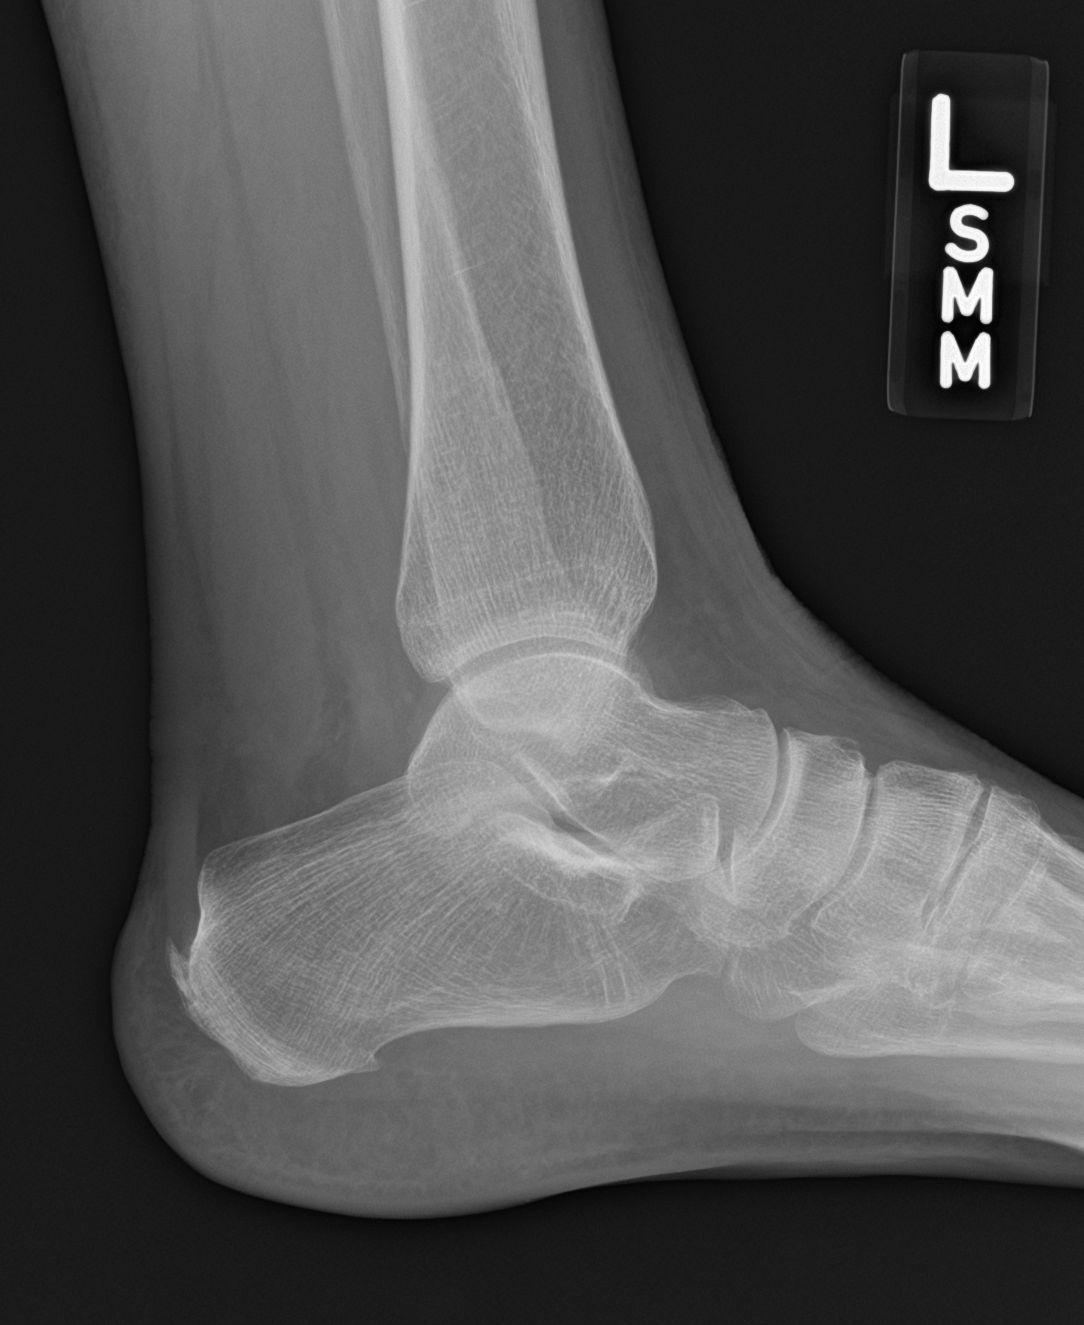

[3 of 3 positions shown; findings below may reference images not displayed]

FINDINGS: Osseous mineralization low normal.

Joint spaces preserved.

Subacute nondisplaced oblique distal LEFT fibular diaphyseal
fracture with minimal periosteal reaction identified on oblique
view.

No additional fracture, dislocation, or bone destruction.

Tiny plantar and Achilles insertion calcaneal spurs.
IMPRESSION: Subacute nondisplaced oblique distal LEFT fibular diaphyseal
fracture.
